# Patient Record
Sex: Male | Born: 1990 | Race: White | Hispanic: No | Marital: Single | Smoking: Former smoker
Health system: Southern US, Community
[De-identification: ages and names within clinical notes are randomized; demographics above are authoritative.]

## PROBLEM LIST (undated history)

## (undated) DIAGNOSIS — R569 Unspecified convulsions: Secondary | ICD-10-CM

## (undated) DIAGNOSIS — F1910 Other psychoactive substance abuse, uncomplicated: Secondary | ICD-10-CM

## (undated) DIAGNOSIS — F32A Depression, unspecified: Secondary | ICD-10-CM

## (undated) DIAGNOSIS — F329 Major depressive disorder, single episode, unspecified: Secondary | ICD-10-CM

## (undated) DIAGNOSIS — E669 Obesity, unspecified: Secondary | ICD-10-CM

## (undated) HISTORY — DX: Unspecified convulsions: R56.9

## (undated) HISTORY — PX: SHOULDER SURGERY: SHX246

## (undated) HISTORY — PX: LEG SURGERY: SHX1003

## (undated) HISTORY — PX: TONSILLECTOMY: SUR1361

## (undated) HISTORY — PX: OTHER SURGICAL HISTORY: SHX169

---
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
## 2009-05-20 DIAGNOSIS — J309 Allergic rhinitis, unspecified: Secondary | ICD-10-CM | POA: Insufficient documentation

## 2009-05-20 DIAGNOSIS — E669 Obesity, unspecified: Secondary | ICD-10-CM | POA: Insufficient documentation

## 2011-03-14 ENCOUNTER — Encounter: Payer: Self-pay | Admitting: Neurosurgery

## 2011-04-13 ENCOUNTER — Encounter: Payer: Self-pay | Admitting: Neurosurgery

## 2011-06-14 ENCOUNTER — Encounter: Payer: Self-pay | Admitting: Orthopedic Surgery

## 2011-07-11 ENCOUNTER — Encounter: Payer: Self-pay | Admitting: Orthopedic Surgery

## 2011-08-11 ENCOUNTER — Encounter: Payer: Self-pay | Admitting: Orthopedic Surgery

## 2011-09-10 ENCOUNTER — Encounter: Payer: Self-pay | Admitting: Orthopedic Surgery

## 2012-04-28 ENCOUNTER — Ambulatory Visit: Payer: Self-pay | Admitting: Unknown Physician Specialty

## 2013-08-13 LAB — BASIC METABOLIC PANEL
BUN: 13 mg/dL (ref 4–21)
Creatinine: 0.7 mg/dL (ref 0.6–1.3)
GLUCOSE: 94 mg/dL
POTASSIUM: 4.6 mmol/L (ref 3.4–5.3)
Sodium: 140 mmol/L (ref 137–147)

## 2013-08-13 LAB — HEPATIC FUNCTION PANEL
ALT: 56 U/L — AB (ref 10–40)
AST: 22 U/L (ref 14–40)
Alkaline Phosphatase: 76 U/L (ref 25–125)
BILIRUBIN, TOTAL: 0.8 mg/dL

## 2013-08-13 LAB — CBC AND DIFFERENTIAL
HCT: 44 % (ref 41–53)
HEMOGLOBIN: 15.3 g/dL (ref 13.5–17.5)
NEUTROS ABS: 6 /uL
PLATELETS: 321 10*3/uL (ref 150–399)
WBC: 8.5 10*3/mL

## 2013-08-13 LAB — TSH: TSH: 0.99 u[IU]/mL (ref 0.41–5.90)

## 2013-09-30 ENCOUNTER — Emergency Department: Payer: Self-pay | Admitting: Internal Medicine

## 2013-09-30 LAB — CBC
HCT: 45.1 % (ref 40.0–52.0)
HGB: 15.7 g/dL (ref 13.0–18.0)
MCH: 29.7 pg (ref 26.0–34.0)
MCHC: 34.8 g/dL (ref 32.0–36.0)
MCV: 85 fL (ref 80–100)
Platelet: 327 10*3/uL (ref 150–440)
RBC: 5.29 10*6/uL (ref 4.40–5.90)
RDW: 13 % (ref 11.5–14.5)
WBC: 8 10*3/uL (ref 3.8–10.6)

## 2013-09-30 LAB — DRUG SCREEN, URINE
Amphetamines, Ur Screen: NEGATIVE (ref ?–1000)
Barbiturates, Ur Screen: NEGATIVE (ref ?–200)
Benzodiazepine, Ur Scrn: NEGATIVE (ref ?–200)
CANNABINOID 50 NG, UR ~~LOC~~: NEGATIVE (ref ?–50)
Cocaine Metabolite,Ur ~~LOC~~: NEGATIVE (ref ?–300)
MDMA (ECSTASY) UR SCREEN: NEGATIVE (ref ?–500)
METHADONE, UR SCREEN: NEGATIVE (ref ?–300)
Opiate, Ur Screen: POSITIVE (ref ?–300)
PHENCYCLIDINE (PCP) UR S: NEGATIVE (ref ?–25)
Tricyclic, Ur Screen: NEGATIVE (ref ?–1000)

## 2013-09-30 LAB — COMPREHENSIVE METABOLIC PANEL
ALK PHOS: 76 U/L
ALT: 49 U/L
ANION GAP: 8 (ref 7–16)
Albumin: 3.7 g/dL (ref 3.4–5.0)
BUN: 11 mg/dL (ref 7–18)
Bilirubin,Total: 0.9 mg/dL (ref 0.2–1.0)
Calcium, Total: 8.7 mg/dL (ref 8.5–10.1)
Chloride: 102 mmol/L (ref 98–107)
Co2: 26 mmol/L (ref 21–32)
Creatinine: 0.88 mg/dL (ref 0.60–1.30)
EGFR (Non-African Amer.): >60
GLUCOSE: 72 mg/dL (ref 65–99)
Osmolality: 270 (ref 275–301)
Potassium: 4.1 mmol/L (ref 3.5–5.1)
SGOT(AST): 24 U/L (ref 15–37)
Sodium: 136 mmol/L (ref 136–145)
Total Protein: 7.7 g/dL (ref 6.4–8.2)

## 2013-09-30 LAB — ACETAMINOPHEN LEVEL: Acetaminophen: <2 ug/mL

## 2013-09-30 LAB — TSH: Thyroid Stimulating Horm: 0.944 u[IU]/mL

## 2013-09-30 LAB — URINALYSIS, COMPLETE
Bilirubin,UR: NEGATIVE
Blood: NEGATIVE
GLUCOSE, UR: NEGATIVE mg/dL (ref 0–75)
Ketone: NEGATIVE
Leukocyte Esterase: NEGATIVE
NITRITE: NEGATIVE
PROTEIN: NEGATIVE
Ph: 6 (ref 4.5–8.0)
SPECIFIC GRAVITY: 1.01 (ref 1.003–1.030)

## 2013-09-30 LAB — D-DIMER(ARMC): D-Dimer: 365 ng/ml

## 2013-09-30 LAB — SALICYLATE LEVEL: Salicylates, Serum: <1.7 mg/dL

## 2013-09-30 LAB — ETHANOL: Ethanol: <3 mg/dL

## 2013-10-05 ENCOUNTER — Encounter: Payer: Self-pay | Admitting: Neurology

## 2013-10-05 ENCOUNTER — Ambulatory Visit (INDEPENDENT_AMBULATORY_CARE_PROVIDER_SITE_OTHER): Payer: BC Managed Care – PPO | Admitting: Neurology

## 2013-10-05 ENCOUNTER — Ambulatory Visit (INDEPENDENT_AMBULATORY_CARE_PROVIDER_SITE_OTHER): Payer: BC Managed Care – PPO | Admitting: Radiology

## 2013-10-05 DIAGNOSIS — R569 Unspecified convulsions: Secondary | ICD-10-CM
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
## 2013-10-06 NOTE — Procedures (Signed)
   HISTORY: 23 years old male presented with seizure event in July 2015  TECHNIQUE:  16 channel EEG was performed based on standard 10-16 international system. One channel was dedicated to EKG, which has demonstrates normal sinus rhythm of 84 beats per minutes.  Upon awakening, the posterior background activity was well-developed, in alpha range, 9 Hz,   reactive to eye opening and closure.  There was no evidence of epileptiform discharge.  Photic stimulation was performed, which induced a symmetric photic driving.  Hyperventilation was performed, there was no abnormality elicit.  Stage II sleep was achieved, as evident by k complex and sleep spindles.  CONCLUSION: This is a  normal awake and asleep EEG.  There is no electrodiagnostic evidence of epileptiform discharge

## 2013-10-07 ENCOUNTER — Ambulatory Visit: Payer: Self-pay | Admitting: Family Medicine

## 2013-10-16 ENCOUNTER — Ambulatory Visit
Admission: RE | Admit: 2013-10-16 | Discharge: 2013-10-16 | Disposition: A | Discharge status: Home / Self Care | Payer: BC Managed Care – PPO | Source: Ambulatory Visit | Attending: Neurology | Admitting: Neurology

## 2013-10-16 DIAGNOSIS — R569 Unspecified convulsions: Secondary | ICD-10-CM

## 2013-10-16 MED ORDER — GADOBENATE DIMEGLUMINE 529 MG/ML IV SOLN
20.0000 mL | Freq: Once | INTRAVENOUS | Status: AC | PRN
  Administered 2013-10-16: 20 mL via INTRAVENOUS

## 2013-10-19 ENCOUNTER — Telehealth: Payer: Self-pay | Admitting: Neurology

## 2013-10-19 NOTE — Telephone Encounter (Signed)
Phillip Moss,Phillip Moss DOB 1990/07/30 is returning a call.

## 2013-10-19 NOTE — Telephone Encounter (Signed)
Patient was left a vm about the results.

## 2013-10-19 NOTE — Telephone Encounter (Signed)
Please call.

## 2014-04-08 ENCOUNTER — Ambulatory Visit: Payer: BC Managed Care – PPO | Admitting: Nurse Practitioner

## 2015-01-31 DIAGNOSIS — F1110 Opioid abuse, uncomplicated: Secondary | ICD-10-CM | POA: Insufficient documentation

## 2015-01-31 DIAGNOSIS — F1121 Opioid dependence, in remission: Secondary | ICD-10-CM | POA: Insufficient documentation

## 2015-01-31 DIAGNOSIS — F4310 Post-traumatic stress disorder, unspecified: Secondary | ICD-10-CM | POA: Insufficient documentation

## 2015-01-31 DIAGNOSIS — F321 Major depressive disorder, single episode, moderate: Secondary | ICD-10-CM | POA: Insufficient documentation

## 2015-01-31 DIAGNOSIS — G2579 Other drug induced movement disorders: Secondary | ICD-10-CM | POA: Insufficient documentation

## 2015-01-31 DIAGNOSIS — F419 Anxiety disorder, unspecified: Secondary | ICD-10-CM | POA: Insufficient documentation

## 2015-01-31 DIAGNOSIS — F410 Panic disorder [episodic paroxysmal anxiety] without agoraphobia: Secondary | ICD-10-CM | POA: Insufficient documentation

## 2015-02-01 ENCOUNTER — Encounter: Payer: Self-pay | Admitting: Family Medicine

## 2015-02-01 ENCOUNTER — Ambulatory Visit (INDEPENDENT_AMBULATORY_CARE_PROVIDER_SITE_OTHER): Payer: BLUE CROSS/BLUE SHIELD | Admitting: Family Medicine

## 2015-02-01 VITALS — BP 112/68 | HR 76 | Temp 97.9°F | Resp 16 | Ht 71.0 in | Wt 311.0 lb

## 2015-02-01 DIAGNOSIS — R634 Abnormal weight loss: Secondary | ICD-10-CM

## 2015-02-01 DIAGNOSIS — R197 Diarrhea, unspecified: Secondary | ICD-10-CM | POA: Diagnosis not present

## 2015-02-01 DIAGNOSIS — E739 Lactose intolerance, unspecified: Secondary | ICD-10-CM

## 2015-02-01 DIAGNOSIS — R5383 Other fatigue: Secondary | ICD-10-CM

## 2015-02-01 DIAGNOSIS — R1084 Generalized abdominal pain: Secondary | ICD-10-CM

## 2015-02-01 NOTE — Progress Notes (Signed)
Patient ID: Phillip Moss, male   DOB: 03-30-1990, 24 y.o.   MRN: [id number]    Subjective:  HPI Pt is here for today for weight loss, abdominal and very soft stools, almost diarrhea. He had this since he stopped using drugs and alcohol in 11/2013 and during his drug use. He always thought it was from the alcohol and drug use. He had been clean a year and still having these problems. He said that it is intermittent and if he does not eat, he does not have these symptoms Per his records he has lost 26 pounds, granted he is overweight but he is not trying to lose weight and continues to lose weight. He also says that he has a lot of fatigue and intermittent lose of appetite and occasionally nausea.  Pt reports that he thought his symptoms were from Vivtrol but these symptoms have gotten worse since he stopped the Vivtrol injections.    Prior to Admission medications   Medication Sig Start Date End Date Taking? Authorizing Provider  buPROPion (WELLBUTRIN XL) 300 MG 24 hr tablet Take by mouth. 06/29/14  Yes Historical Provider, MD  naltrexone (DEPADE) 50 MG tablet PRN 01/10/15  Yes Historical Provider, MD  sertraline (ZOLOFT) 100 MG tablet Take 100 mg by mouth daily.   Yes Historical Provider, MD  Naltrexone (VIVITROL) 380 MG SUSR Inject into the muscle.    Historical Provider, MD    Patient Active Problem List   Diagnosis Date Noted  . Anxiety 01/31/2015  . Major depressive disorder, single episode, moderate (HCC) 01/31/2015  . Drug abuse, opioid type 01/31/2015  . Opioid dependence in remission (HCC) 01/31/2015  . Panic attack 01/31/2015  . Posttraumatic stress disorder 01/31/2015  . Serotonergic syndrome 01/31/2015  . Seizure (HCC)   . Allergic rhinitis 05/20/2009  . Adiposity 05/20/2009    Past Medical History  Diagnosis Date  . Seizure [facility](HCC)     Social History   Social History  . Marital Status: Single    Spouse Name: N/A  . Number of Children: 0  . Years of Education:  college   Occupational History  .      [employer]   Social History Main Topics  . Smoking status: Former Games developermoker  . Smokeless tobacco: Never Used     Comment: Quit in 2016  . Alcohol Use: No     Comment: heavy drinker in the past  . Drug Use: No     Comment: heroin use in the past. Completed rehab  . Sexual Activity: Not on file   Other Topics Concern  . Not on file   Social History Narrative   Patient lives at home with his parents. Patient works at [employer].    Education college.   Right handed.   Caffeine - None    Allergies  Allergen Reactions  . Penicillins     rash    Review of Systems  Constitutional: Positive for weight loss and malaise/fatigue.  HENT: Negative.   Eyes: Negative.   Respiratory: Negative.   Cardiovascular: Negative.   Gastrointestinal: Positive for nausea.  Genitourinary: Negative.   Musculoskeletal: Negative.   Skin: Negative.   Neurological: Negative.   Endo/Heme/Allergies: Negative.   Psychiatric/Behavioral: Negative.      There is no immunization history on file for this patient. Objective:  BP 112/68 mmHg  Pulse 76  Temp(Src) 97.9 F (36.6 C) (Oral)  Resp 16  Ht 5\' 11"  (1.803 m)  Wt 311  lb (141.069 kg)  BMI 43.40 kg/m2  Physical Exam  Constitutional: He is oriented to person, place, and time and well-developed, well-nourished, and in no distress.  HENT:  Head: Normocephalic and atraumatic.  Right Ear: External ear normal.  Left Ear: External ear normal.  Nose: Nose normal.  Mouth/Throat: Oropharynx is clear and moist.  Eyes: Conjunctivae and EOM are normal. Pupils are equal, round, and reactive to light.  Neck: Normal range of motion. Neck supple.  Cardiovascular: Normal rate, regular rhythm, normal heart sounds and intact distal pulses.   Pulmonary/Chest: Effort normal and breath sounds normal.  Abdominal: Soft. Bowel sounds are normal.  Musculoskeletal: Normal range of motion.    Neurological: He is alert and oriented to person, place, and time. He has normal reflexes. Gait normal. GCS score is 15.  Skin: Skin is warm and dry.  Psychiatric: Mood, memory, affect and judgment normal.    Lab Results  Component Value Date   WBC 8.0 09/30/2013   HGB 15.7 09/30/2013   HCT 45.1 09/30/2013   PLT 327 09/30/2013   GLUCOSE 72 09/30/2013   TSH 0.99 08/13/2013    CMP     Component Value Date/Time   NA 136 09/30/2013 1646   NA 140 08/13/2013   K 4.1 09/30/2013 1646   K 4.6 08/13/2013   CL 102 09/30/2013 1646   CO2 26 09/30/2013 1646   GLUCOSE 72 09/30/2013 1646   BUN 11 09/30/2013 1646   BUN 13 08/13/2013   CREATININE 0.88 09/30/2013 1646   CREATININE 0.7 08/13/2013   CALCIUM 8.7 09/30/2013 1646   PROT 7.7 09/30/2013 1646   ALBUMIN 3.7 09/30/2013 1646   AST 24 09/30/2013 1646   AST 22 08/13/2013   ALT 49 09/30/2013 1646   ALT 56* 08/13/2013   ALKPHOS 76 09/30/2013 1646   ALKPHOS 76 08/13/2013   BILITOT 0.9 09/30/2013 1646   GFRNONAA >60 09/30/2013 1646   GFRAA >60 09/30/2013 1646    Assessment and Plan :  1. Generalized abdominal pain  - Comprehensive metabolic panel - Ambulatory referral to Gastroenterology - Sedimentation rate  2. Lactose intolerance Possibly. Pt will stop diary products and see if symptoms get better. Follow up in 2-3 months.  3. Diarrhea, unspecified type Consider stopping glutens in future. - Ambulatory referral to Gastroenterology  4. Weight loss  - CBC with Differential/Platelet - TSH - Ambulatory referral to Gastroenterology - Sedimentation rate  5. Other fatigue  - CBC with Differential/Platelet - TSH - Sedimentation rate 6.Addiction Controlled --off of all substances. Patient was seen and examined by Dr. Julieanne Manson, and noted scribed by Dimas Chyle, CMA  Julieanne Manson MD [facility] 02/01/2015 1:50 PM

## 2015-02-02 LAB — CBC WITH DIFFERENTIAL/PLATELET
Basophils Absolute: 0 10*3/uL (ref 0.0–0.2)
Basos: 0 %
EOS (ABSOLUTE): 0.2 10*3/uL (ref 0.0–0.4)
EOS: 2 %
HEMATOCRIT: 45.4 % (ref 37.5–51.0)
HEMOGLOBIN: 16 g/dL (ref 12.6–17.7)
Immature Grans (Abs): 0 10*3/uL (ref 0.0–0.1)
Immature Granulocytes: 0 %
LYMPHS ABS: 2.2 10*3/uL (ref 0.7–3.1)
Lymphs: 26 %
MCH: 30.5 pg (ref 26.6–33.0)
MCHC: 35.2 g/dL (ref 31.5–35.7)
MCV: 87 fL (ref 79–97)
MONOCYTES: 5 %
MONOS ABS: 0.4 10*3/uL (ref 0.1–0.9)
NEUTROS ABS: 5.5 10*3/uL (ref 1.4–7.0)
Neutrophils: 67 %
Platelets: 353 10*3/uL (ref 150–379)
RBC: 5.25 x10E6/uL (ref 4.14–5.80)
RDW: 12.8 % (ref 12.3–15.4)
WBC: 8.3 10*3/uL (ref 3.4–10.8)

## 2015-02-02 LAB — COMPREHENSIVE METABOLIC PANEL
A/G RATIO: 1.9 (ref 1.1–2.5)
ALBUMIN: 4.9 g/dL (ref 3.5–5.5)
ALK PHOS: 80 IU/L (ref 39–117)
ALT: 26 IU/L (ref 0–44)
AST: 16 IU/L (ref 0–40)
BILIRUBIN TOTAL: 1.1 mg/dL (ref 0.0–1.2)
BUN / CREAT RATIO: 15 (ref 8–19)
BUN: 12 mg/dL (ref 6–20)
CHLORIDE: 99 mmol/L (ref 97–106)
CO2: 25 mmol/L (ref 18–29)
Calcium: 9.9 mg/dL (ref 8.7–10.2)
Creatinine, Ser: 0.78 mg/dL (ref 0.76–1.27)
GFR calc non Af Amer: 125 mL/min/{1.73_m2} (ref >59)
GFR, EST AFRICAN AMERICAN: 144 mL/min/{1.73_m2} (ref >59)
GLOBULIN, TOTAL: 2.6 g/dL (ref 1.5–4.5)
Glucose: 86 mg/dL (ref 65–99)
Potassium: 4.5 mmol/L (ref 3.5–5.2)
SODIUM: 140 mmol/L (ref 136–144)
TOTAL PROTEIN: 7.5 g/dL (ref 6.0–8.5)

## 2015-02-02 LAB — SEDIMENTATION RATE: SED RATE: 4 mm/h (ref 0–15)

## 2015-02-02 LAB — TSH: TSH: 0.807 u[IU]/mL (ref 0.450–4.500)

## 2015-02-07 ENCOUNTER — Ambulatory Visit: Payer: Self-pay | Admitting: Family Medicine

## 2015-02-08 ENCOUNTER — Telehealth: Payer: Self-pay | Admitting: Family Medicine

## 2015-02-08 ENCOUNTER — Telehealth: Payer: Self-pay | Admitting: Gastroenterology

## 2015-02-08 NOTE — Telephone Encounter (Signed)
Please review, anyway to get him any sooner somewhere else-aa

## 2015-02-08 NOTE — Telephone Encounter (Signed)
[facility].  They cant see him until Dec 23.  Can you refer him somewhere that will see him sooner.  His call back [phone]  Thanks Barth Kirkseri

## 2015-02-08 NOTE — Telephone Encounter (Signed)
I have called patient again per the referral sent to see Dr Servando SnareWohl. No answer. I have left a detailed message.

## 2015-02-09 NOTE — Telephone Encounter (Signed)
[name] [facility] 02/10/15 at 8:30

## 2015-02-10 ENCOUNTER — Other Ambulatory Visit: Payer: Self-pay | Admitting: Student

## 2015-02-10 DIAGNOSIS — R1084 Generalized abdominal pain: Secondary | ICD-10-CM

## 2015-02-10 DIAGNOSIS — R194 Change in bowel habit: Secondary | ICD-10-CM

## 2015-02-16 ENCOUNTER — Ambulatory Visit
Admission: RE | Admit: 2015-02-16 | Discharge: 2015-02-16 | Disposition: A | Discharge status: Home / Self Care | Payer: BLUE CROSS/BLUE SHIELD | Source: Ambulatory Visit | Attending: Student | Admitting: Student

## 2015-02-16 DIAGNOSIS — R1084 Generalized abdominal pain: Secondary | ICD-10-CM | POA: Diagnosis present

## 2015-02-16 DIAGNOSIS — R194 Change in bowel habit: Secondary | ICD-10-CM | POA: Diagnosis present

## 2015-02-16 MED ORDER — IOHEXOL 350 MG/ML SOLN
150.0000 mL | Freq: Once | INTRAVENOUS | Status: AC | PRN
  Administered 2015-02-16: 100 mL via INTRAVENOUS

## 2015-03-09 ENCOUNTER — Other Ambulatory Visit: Payer: Self-pay | Admitting: Family Medicine

## 2015-03-10 ENCOUNTER — Other Ambulatory Visit: Payer: Self-pay | Admitting: Family Medicine

## 2015-03-24 ENCOUNTER — Encounter: Payer: Self-pay | Admitting: *Deleted

## 2015-03-25 ENCOUNTER — Ambulatory Visit: Payer: BLUE CROSS/BLUE SHIELD | Admitting: Certified Registered Nurse Anesthetist

## 2015-03-25 ENCOUNTER — Encounter
Admission: RE | Disposition: A | Discharge status: Home / Self Care | Payer: Self-pay | Source: Ambulatory Visit | Attending: Gastroenterology

## 2015-03-25 ENCOUNTER — Encounter: Payer: Self-pay | Admitting: *Deleted

## 2015-03-25 ENCOUNTER — Ambulatory Visit
Admission: RE | Admit: 2015-03-25 | Discharge: 2015-03-25 | Disposition: A | Discharge status: Home / Self Care | Payer: BLUE CROSS/BLUE SHIELD | Source: Ambulatory Visit | Attending: Gastroenterology | Admitting: Gastroenterology

## 2015-03-25 DIAGNOSIS — F329 Major depressive disorder, single episode, unspecified: Secondary | ICD-10-CM | POA: Insufficient documentation

## 2015-03-25 DIAGNOSIS — E669 Obesity, unspecified: Secondary | ICD-10-CM | POA: Insufficient documentation

## 2015-03-25 DIAGNOSIS — K529 Noninfective gastroenteritis and colitis, unspecified: Secondary | ICD-10-CM | POA: Insufficient documentation

## 2015-03-25 DIAGNOSIS — Z6841 Body Mass Index (BMI) 40.0 and over, adult: Secondary | ICD-10-CM | POA: Insufficient documentation

## 2015-03-25 DIAGNOSIS — Z87891 Personal history of nicotine dependence: Secondary | ICD-10-CM | POA: Insufficient documentation

## 2015-03-25 DIAGNOSIS — Z880 Allergy status to penicillin: Secondary | ICD-10-CM | POA: Insufficient documentation

## 2015-03-25 DIAGNOSIS — Z79899 Other long term (current) drug therapy: Secondary | ICD-10-CM | POA: Insufficient documentation

## 2015-03-25 DIAGNOSIS — R1030 Lower abdominal pain, unspecified: Secondary | ICD-10-CM | POA: Insufficient documentation

## 2015-03-25 DIAGNOSIS — R634 Abnormal weight loss: Secondary | ICD-10-CM | POA: Insufficient documentation

## 2015-03-25 HISTORY — DX: Obesity, unspecified: E66.9

## 2015-03-25 HISTORY — DX: Depression, unspecified: F32.A

## 2015-03-25 HISTORY — DX: Major depressive disorder, single episode, unspecified: F32.9

## 2015-03-25 HISTORY — PX: COLONOSCOPY WITH PROPOFOL: SHX5780

## 2015-03-25 HISTORY — DX: Other psychoactive substance abuse, uncomplicated: F19.10

## 2015-03-25 LAB — URINE DRUG SCREEN, QUALITATIVE (ARMC ONLY)
AMPHETAMINES, UR SCREEN: NOT DETECTED
BENZODIAZEPINE, UR SCRN: NOT DETECTED
Barbiturates, Ur Screen: NOT DETECTED
CANNABINOID 50 NG, UR ~~LOC~~: POSITIVE — AB
Cocaine Metabolite,Ur ~~LOC~~: NOT DETECTED
MDMA (ECSTASY) UR SCREEN: NOT DETECTED
METHADONE SCREEN, URINE: NOT DETECTED
Opiate, Ur Screen: NOT DETECTED
Phencyclidine (PCP) Ur S: NOT DETECTED
TRICYCLIC, UR SCREEN: NOT DETECTED

## 2015-03-25 LAB — SURGICAL PATHOLOGY

## 2015-03-25 LAB — HM COLONOSCOPY

## 2015-03-25 SURGERY — COLONOSCOPY WITH PROPOFOL
Anesthesia: General

## 2015-03-25 MED ORDER — LIDOCAINE HCL (CARDIAC) 20 MG/ML IV SOLN
INTRAVENOUS | Status: DC | PRN
  Administered 2015-03-25: 40 mg via INTRAVENOUS

## 2015-03-25 MED ORDER — MIDAZOLAM HCL 2 MG/2ML IJ SOLN
INTRAMUSCULAR | Status: DC | PRN
  Administered 2015-03-25: 2 mg via INTRAVENOUS

## 2015-03-25 MED ORDER — SODIUM CHLORIDE 0.9 % IV SOLN
INTRAVENOUS | Status: DC
  Administered 2015-03-25: 13:00:00 via INTRAVENOUS

## 2015-03-25 MED ORDER — PROPOFOL 10 MG/ML IV BOLUS
INTRAVENOUS | Status: DC | PRN
  Administered 2015-03-25 (×2): 20 mg via INTRAVENOUS
  Administered 2015-03-25: 80 mg via INTRAVENOUS
  Administered 2015-03-25: 50 mg via INTRAVENOUS
  Administered 2015-03-25: 20 mg via INTRAVENOUS
  Administered 2015-03-25: 50 mg via INTRAVENOUS
  Administered 2015-03-25 (×2): 30 mg via INTRAVENOUS

## 2015-03-25 MED ORDER — PROPOFOL 500 MG/50ML IV EMUL
INTRAVENOUS | Status: DC | PRN
  Administered 2015-03-25: 180 ug/kg/min via INTRAVENOUS

## 2015-03-25 NOTE — H&P (Signed)
  Primary Care Physician:  Phillip Mansichard Gilbert Jr, MD  Pre-Procedure History & Physical: HPI:  Phillip Moss is a 25 y.o. male is here for an colonoscopy.   Past Medical History  Diagnosis Date  . Seizure (HCC)   . Depression   . Drug abuse   . Obesity     Past Surgical History  Procedure Laterality Date  . Rod in leg Left   . Plate in shoulder Left   . Leg surgery      rod put in in the left leg after a fracture  . Shoulder surgery      plate put in after fracture-left  . Tonsillectomy      Prior to Admission medications   Medication Sig Start Date End Date Taking? Authorizing Provider  buPROPion (WELLBUTRIN XL) 300 MG 24 hr tablet TAKE 1 TABLET EVERY DAY 03/11/15  Yes Phillip Moss., MD  sertraline (ZOLOFT) 100 MG tablet Take 100 mg by mouth daily.   Yes Historical Provider, MD  naltrexone (DEPADE) 50 MG tablet Reported on 03/25/2015 01/10/15   Historical Provider, MD  Naltrexone (VIVITROL) 380 MG SUSR Inject into the muscle. Reported on 03/25/2015    Historical Provider, MD    Allergies as of 03/24/2015 - Review Complete 03/24/2015  Allergen Reaction Noted  . Penicillins  10/05/2013    Family History  Problem Relation Age of Onset  . High blood pressure Mother   . Hypertension Mother   . Hyperlipidemia Father     Social History   Social History  . Marital Status: Single    Spouse Name: N/A  . Number of Children: 0  . Years of Education: college   Occupational History  .      [employer]   Social History Main Topics  . Smoking status: Former Smoker    Quit date: 03/24/2014  . Smokeless tobacco: Never Used     Comment: Quit in 2016  . Alcohol Use: No     Comment: heavy drinker in the past  . Drug Use: No     Comment: heroin use in the past. Completed rehab  . Sexual Activity: Not on file   Other Topics Concern  . Not on file   Social History Narrative   Patient lives at home with his parents. Patient works at [employer].    Education college.   Right handed.   Caffeine - None     Physical Exam: BP 147/89 mmHg  Pulse 82  Temp(Src) 98.5 F (36.9 C) (Tympanic)  Resp 24  Ht 6' (1.829 m)  Wt 133.811 kg (295 lb)  BMI 40.00 kg/m2  SpO2 98% General:   Alert,  pleasant and cooperative in NAD Head:  Normocephalic and atraumatic. Neck:  Supple; no masses or thyromegaly. Lungs:  Clear throughout to auscultation.    Heart:  Regular rate and rhythm. Abdomen:  Soft, nontender and nondistended. Normal bowel sounds, without guarding, and without rebound.   Neurologic:  Alert and  oriented x4;  grossly normal neurologically.  Impression/Plan: Phillip Moss is here for an colonoscopy to be performed for diarrhea, weight loss, abd pain  Risks, benefits, limitations, and alternatives regarding  colonoscopy have been reviewed with the patient.  Questions have been answered.  All parties agreeable.   [name], [name], MD  03/25/2015, 2:22 PM

## 2015-03-25 NOTE — Transfer of Care (Signed)
Immediate Anesthesia Transfer of Care Note  Patient: Phillip FountainWilliam E Kalata Moss  Procedure(s) Performed: Procedure(s): COLONOSCOPY WITH PROPOFOL (N/A)  Patient Location: PACU  Anesthesia Type:General  Level of Consciousness: awake, alert  and oriented  Airway & Oxygen Therapy: Patient Spontanous Breathing and Patient connected to nasal cannula oxygen  Post-op Assessment: Report given to RN and Post -op Vital signs reviewed and stable  Post vital signs: Reviewed and stable  Last Vitals:  Filed Vitals:   03/25/15 1250  BP: 147/89  Pulse: 82  Temp: 36.9 C  Resp: 24    Complications: No apparent anesthesia complications

## 2015-03-25 NOTE — Anesthesia Procedure Notes (Signed)
Performed by: Lagina Reader Pre-anesthesia Checklist: Patient identified, Emergency Drugs available, Suction available and Patient being monitored Patient Re-evaluated:Patient Re-evaluated prior to inductionOxygen Delivery Method: Nasal cannula Intubation Type: IV induction       

## 2015-03-25 NOTE — Discharge Instructions (Signed)

## 2015-03-25 NOTE — Anesthesia Preprocedure Evaluation (Signed)
Anesthesia Evaluation  Patient identified by MRN, date of birth, ID band Patient awake    Reviewed: Allergy & Precautions, NPO status , Patient's Chart, lab work & pertinent test results  History of Anesthesia Complications Negative for: history of anesthetic complications  Airway Mallampati: III       Dental  (+) Teeth Intact   Pulmonary neg pulmonary ROS, former smoker,           Cardiovascular negative cardio ROS       Neuro/Psych Seizures - (tramadol induced),  Anxiety Depression    GI/Hepatic negative GI ROS, Neg liver ROS,   Endo/Other  negative endocrine ROS  Renal/GU negative Renal ROS     Musculoskeletal   Abdominal   Peds  Hematology negative hematology ROS (+)   Anesthesia Other Findings   Reproductive/Obstetrics                             Anesthesia Physical Anesthesia Plan  ASA: II  Anesthesia Plan: General   Post-op Pain Management:    Induction: Intravenous  Airway Management Planned: Nasal Cannula  Additional Equipment:   Intra-op Plan:   Post-operative Plan:   Informed Consent: I have reviewed the patients History and Physical, chart, labs and discussed the procedure including the risks, benefits and alternatives for the proposed anesthesia with the patient or authorized representative who has indicated his/her understanding and acceptance.     Plan Discussed with:   Anesthesia Plan Comments:         Anesthesia Quick Evaluation

## 2015-03-25 NOTE — Op Note (Signed)
[facility] Patient Name: Phillip Moss Procedure Date: 03/25/2015 2:18 PM MRN: [id number] Account #: [id number] Date of Birth: Nov 13, 1990 Admit Type: Outpatient Age: [age] [facility] Gender: Male Note Status: Finalized Procedure:         Colonoscopy Indications:       Chronic diarrhea, Lower abdominal pain, Weight loss Patient Profile:   This is a 25 year old male. Providers:         Rhona RaiderMatthew G. Shelle Ironein, MD Referring MD:      Ferdinand Langoichard L. Sullivan LoneGilbert, MD (Referring MD) Medicines:         Propofol per Anesthesia Complications:     No immediate complications. Procedure:         Pre-Anesthesia Assessment:                    - Prior to the procedure, a History and Physical was                     performed, and patient medications, allergies and                     sensitivities were reviewed. The patient's tolerance of                     previous anesthesia was reviewed.                    After obtaining informed consent, the colonoscope was                     passed under direct vision. Throughout the procedure, the                     patient's blood pressure, pulse, and oxygen saturations                     were monitored continuously. The Colonoscope was                     introduced through the anus and advanced to the the                     terminal ileum. The colonoscopy was performed without                     difficulty. The patient tolerated the procedure well. The                     quality of the bowel preparation was fair. Findings:      The perianal and digital rectal examinations were normal.      The terminal ileum appeared normal.      The entire examined colon appeared normal on direct and retroflexion       views.      Biopsies for histology were taken with a cold forceps from the right       colon, left colon and rectum for evaluation of microscopic colitis. Impression:        - The examined portion of the ileum was  normal.                    - The entire examined colon is normal on direct and  retroflexion views.                    - Biopsies were taken with a cold forceps from the right                     colon, left colon and rectum for evaluation of microscopic                     colitis. Recommendation:    - Observe patient in GI recovery unit.                    - Continue present medications.                    - Await pathology results.                    - Return to GI clinic.                    - Consider xifaxin for IBS-D if biopsies are non-diagnostic                    - Try low fodmap diet, probiotic                    - Perform stool studies.                    - The findings and recommendations were discussed with the                     patient.                    - The findings and recommendations were discussed with the                     patient's family. Procedure Code(s): --- Professional ---                    [phone], Colonoscopy, flexible; with biopsy, single or                     multiple Diagnosis Code(s): --- Professional ---                    K52.9, Noninfective gastroenteritis and colitis,                     unspecified                    R10.30, Lower abdominal pain, unspecified                    R63.4, Abnormal weight loss CPT copyright 2014 American Medical Association. All rights reserved. The codes documented in this report are preliminary and upon coder review may  be revised to meet current compliance requirements. Kathalene Frames, MD 03/25/2015 2:49:33 PM This report has been signed electronically. Number of Addenda: 0 Note Initiated On: 03/25/2015 2:18 PM Scope Withdrawal Time: 0 hours 11 minutes 47 seconds  Total Procedure Duration: 0 hours 13 minutes 4 seconds       [facility] [place]

## 2015-03-26 NOTE — Progress Notes (Signed)
Reached voicemail.  No message left.

## 2015-03-29 NOTE — Anesthesia Postprocedure Evaluation (Signed)
Anesthesia Post Note  Patient: Phillip Moss  Procedure(s) Performed: Procedure(s) (LRB): COLONOSCOPY WITH PROPOFOL (N/A)  Patient location during evaluation: Endoscopy Anesthesia Type: General Level of consciousness: awake and alert Pain management: pain level controlled Vital Signs Assessment: post-procedure vital signs reviewed and stable Respiratory status: spontaneous breathing, nonlabored ventilation, respiratory function stable and patient connected to nasal cannula oxygen Cardiovascular status: blood pressure returned to baseline and stable Postop Assessment: no signs of nausea or vomiting Anesthetic complications: no    Last Vitals:  Filed Vitals:   03/25/15 1518 03/25/15 1528  BP: 123/89 110/55  Pulse: 70 71  Temp:    Resp: 16 15    Last Pain: There were no vitals filed for this visit.               Lenard Simmer

## 2015-03-30 ENCOUNTER — Encounter: Payer: Self-pay | Admitting: Gastroenterology

## 2015-05-04 ENCOUNTER — Ambulatory Visit: Payer: Self-pay | Admitting: Family Medicine

## 2015-07-04 ENCOUNTER — Other Ambulatory Visit: Payer: Self-pay | Admitting: Family Medicine

## 2015-09-27 IMAGING — CT CT HEAD WITHOUT CONTRAST
1 series · 16 of 30 positions shown, 20 images · non-contrast
Comparison: None.

CLINICAL DATA: Seizure.  Postictal confusion.

EXAM:
CT HEAD WITHOUT CONTRAST
TECHNIQUE: Contiguous axial images were obtained from the base of the skull
through the vertex without intravenous contrast.

[Series 2: head wo · axial · 0.45mm/px · z∈[-116,+28]mm · 16 of 36 slices shown, 20 images]
[im 2/36  brain]
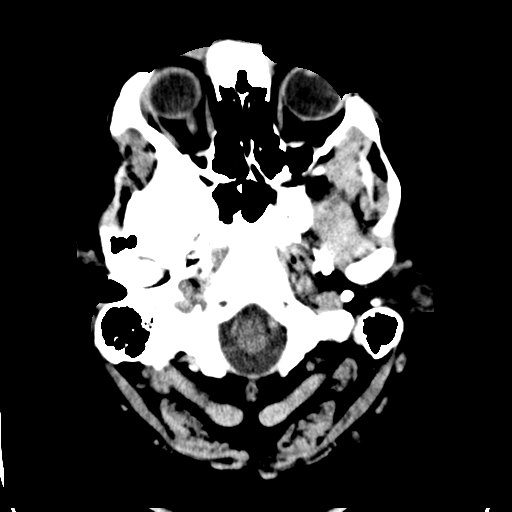
[im 2/36  bone]
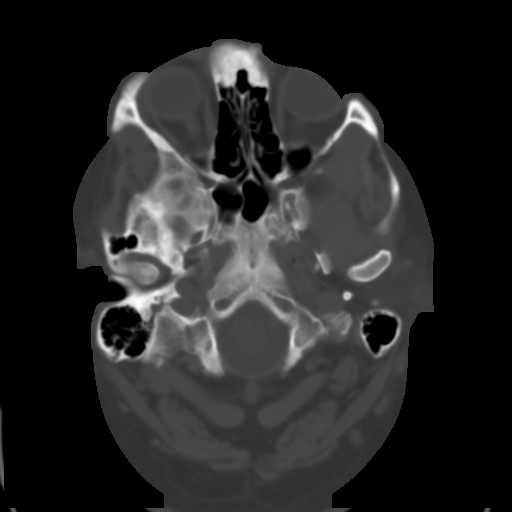
[im 4/36  brain]
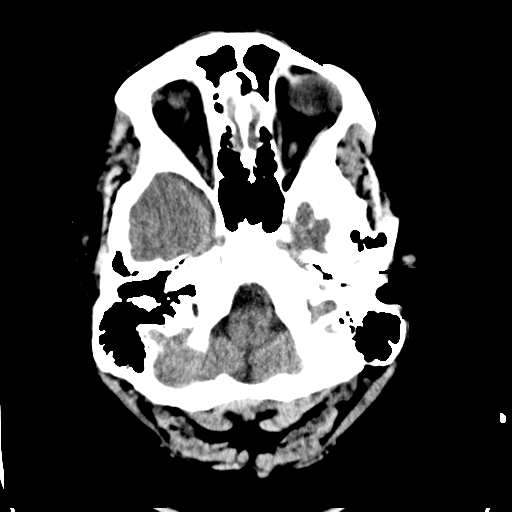
[im 7/36  brain]
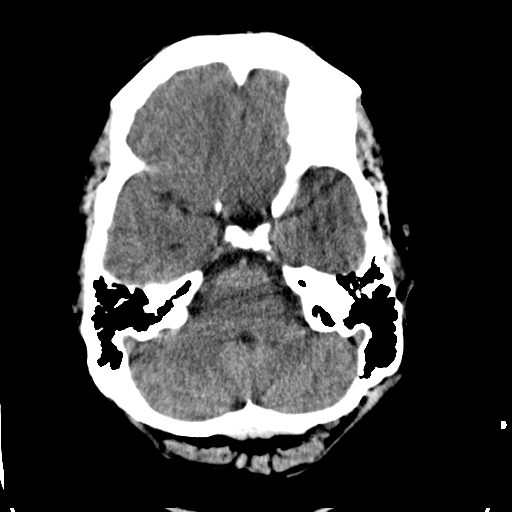
[im 9/36  brain]
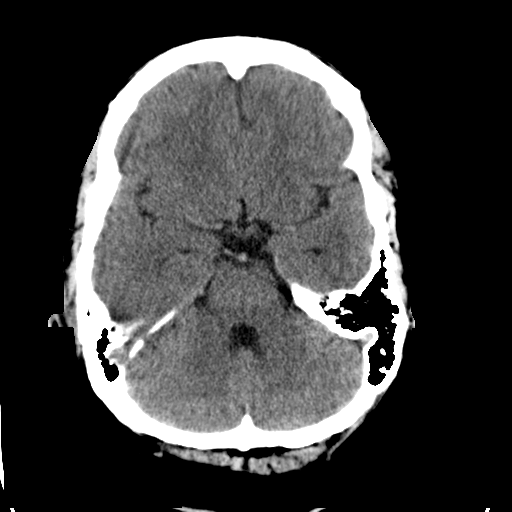
[im 10/36  brain]
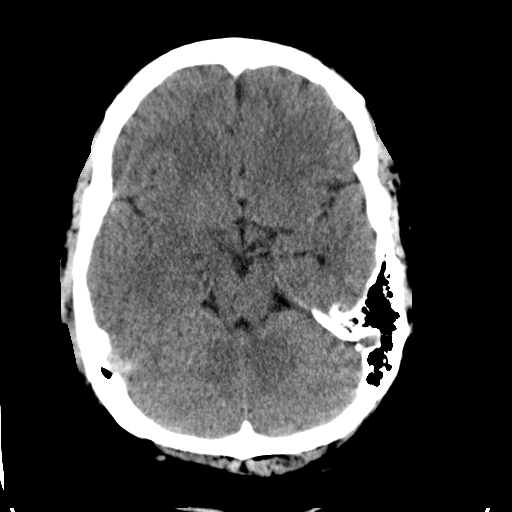
[im 10/36  bone]
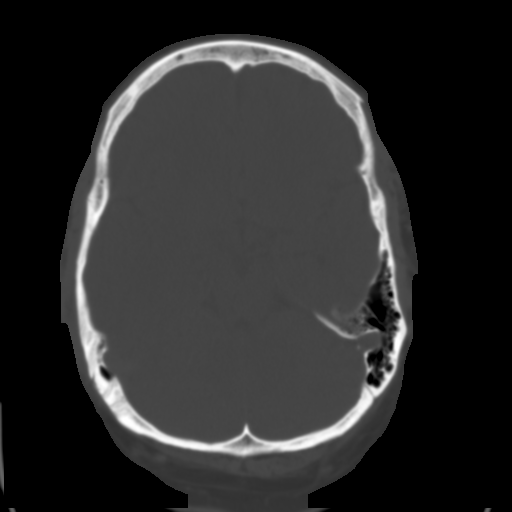
[im 13/36  brain]
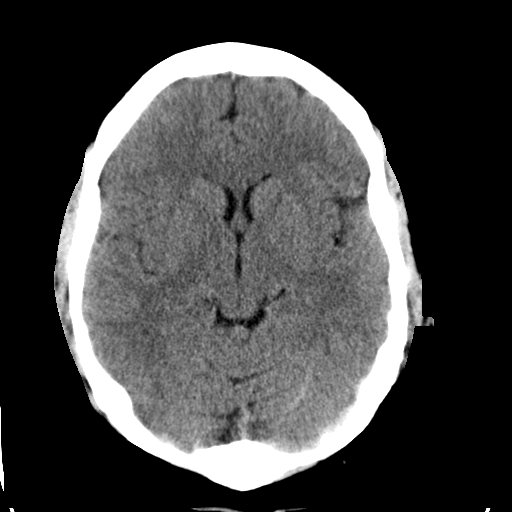
[im 15/36  brain]
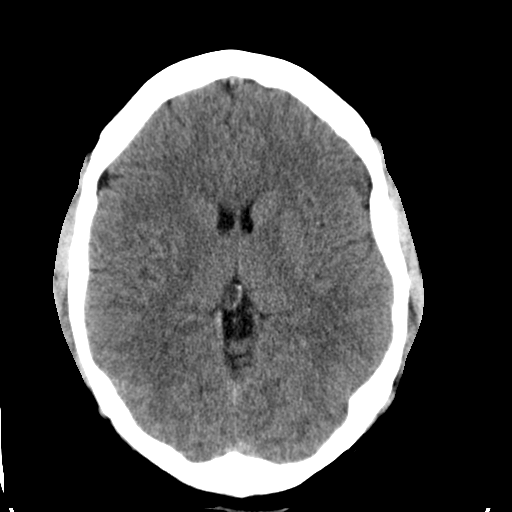
[im 17/36  brain]
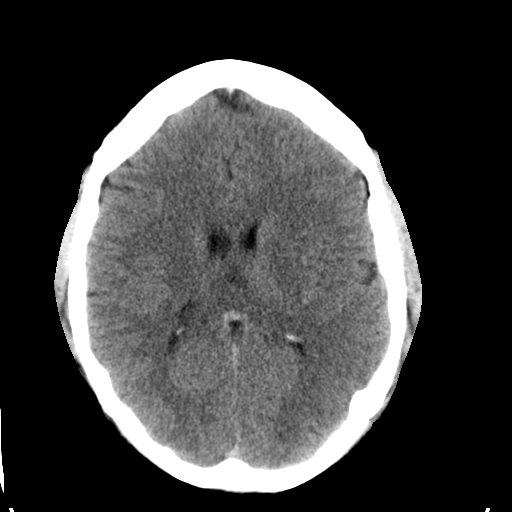
[im 19/36  brain]
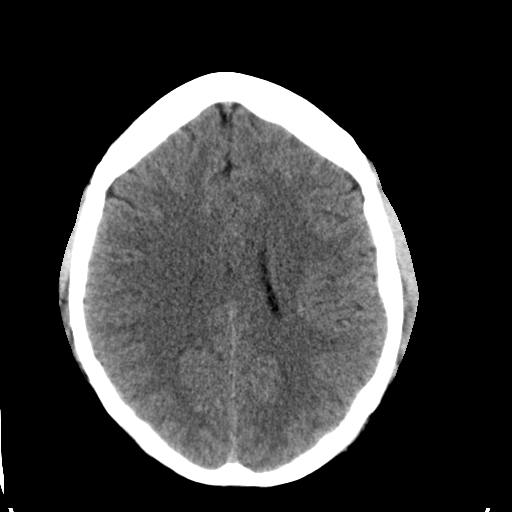
[im 19/36  bone]
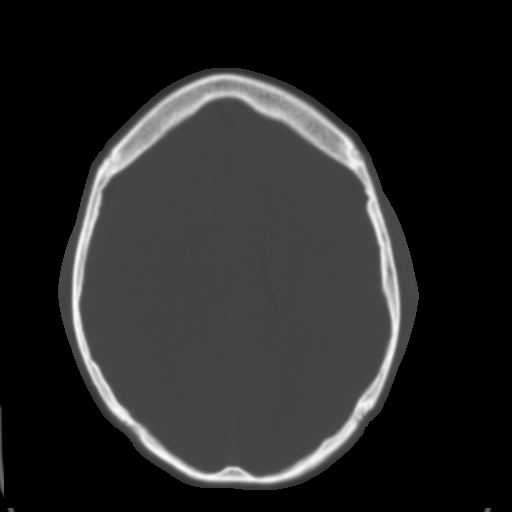
[im 21/36  brain]
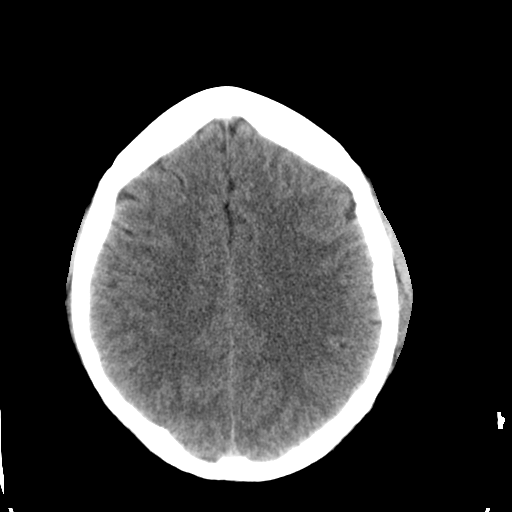
[im 23/36  brain]
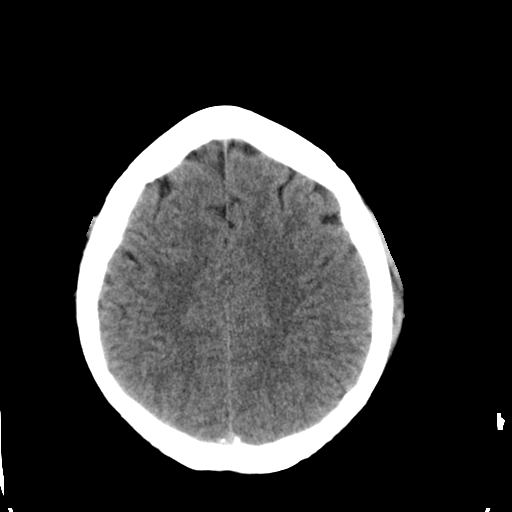
[im 26/36  brain]
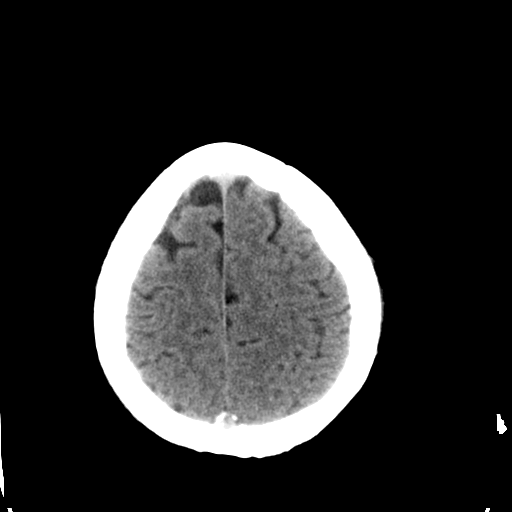
[im 27/36  brain]
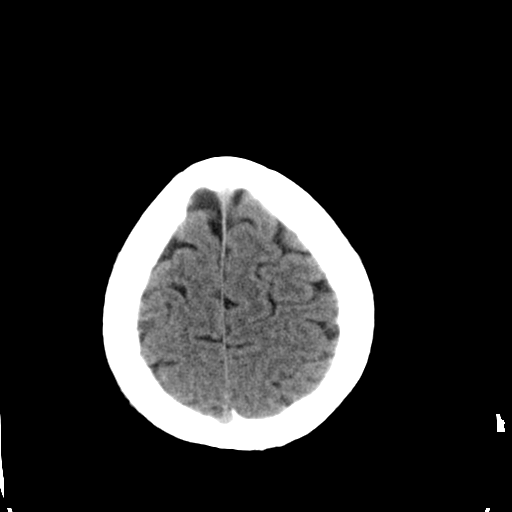
[im 27/36  bone]
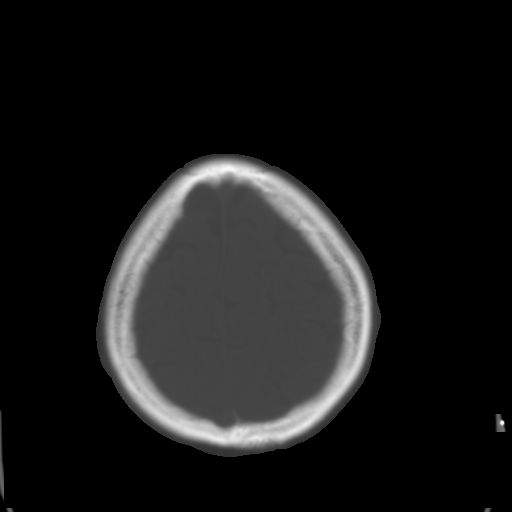
[im 29/36  brain]
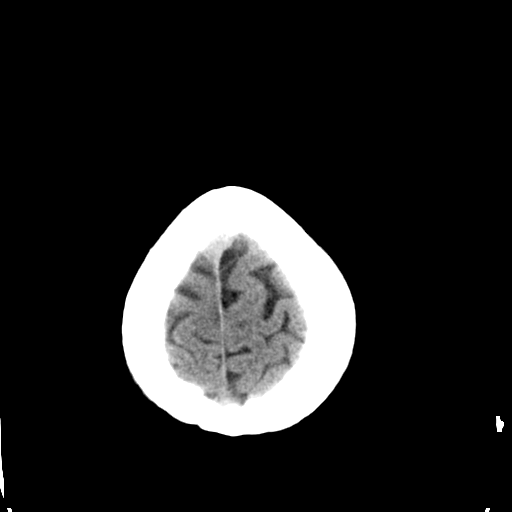
[im 32/36  brain]
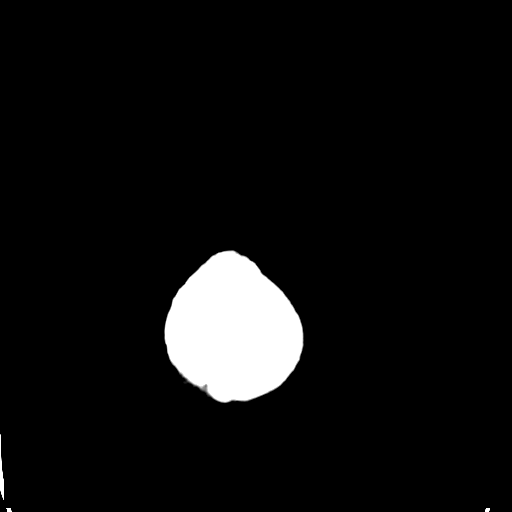
[im 34/36  brain]
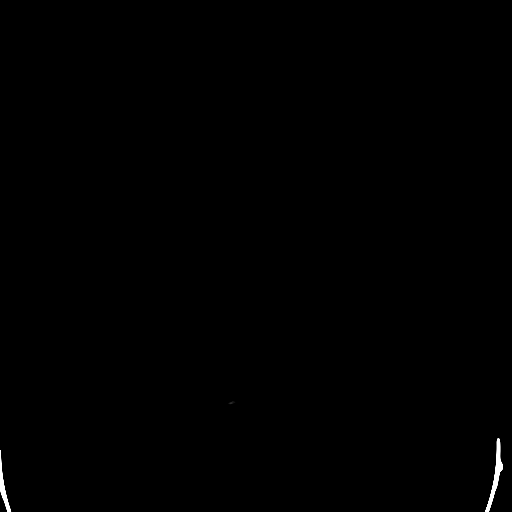

[16 of 30 positions shown; findings below may reference images not displayed]

FINDINGS: No evidence of intracranial hemorrhage, brain edema, or other signs
of acute infarction. No evidence of intracranial mass lesion or mass
effect. No abnormal extraaxial fluid collections identified.
Ventricles are normal in size. No skull abnormality identified.
IMPRESSION: Negative noncontrast head CT.

## 2016-02-20 ENCOUNTER — Other Ambulatory Visit: Payer: Self-pay | Admitting: Family Medicine

## 2016-04-04 ENCOUNTER — Ambulatory Visit (INDEPENDENT_AMBULATORY_CARE_PROVIDER_SITE_OTHER): Payer: BLUE CROSS/BLUE SHIELD | Admitting: Family Medicine

## 2016-04-04 ENCOUNTER — Encounter: Payer: Self-pay | Admitting: Family Medicine

## 2016-04-04 VITALS — BP 132/80 | HR 90 | Temp 98.7°F | Resp 18 | Wt 323.0 lb

## 2016-04-04 DIAGNOSIS — J40 Bronchitis, not specified as acute or chronic: Secondary | ICD-10-CM

## 2016-04-04 DIAGNOSIS — R509 Fever, unspecified: Secondary | ICD-10-CM | POA: Diagnosis not present

## 2016-04-04 DIAGNOSIS — J111 Influenza due to unidentified influenza virus with other respiratory manifestations: Secondary | ICD-10-CM | POA: Diagnosis not present

## 2016-04-04 MED ORDER — OSELTAMIVIR PHOSPHATE 75 MG PO CAPS: 75.0000 mg | ORAL_CAPSULE | Freq: Two times a day (BID) | ORAL | 0 refills | Status: DC

## 2016-04-04 MED ORDER — AZITHROMYCIN 250 MG PO TABS: ORAL_TABLET | ORAL | 0 refills | Status: DC

## 2016-04-04 NOTE — Progress Notes (Signed)
Subjective:  HPI Pt is here today because last night he was at the dinner table and started feeling bad then by the end of the night he had a fever from 101-102 and has had it on and off ever since. He reports that he also has body aches, chills, cough, congestion and feels the worst he's ever felt. He did not get a flu vaccine.  Prior to Admission medications   Medication Sig Start Date End Date Taking? Authorizing Provider  sertraline (ZOLOFT) 100 MG tablet TAKE 1 TABLET EVERY DAY 02/20/16  Yes Maple Hudsonichard L Randell Teare Jr., MD  buPROPion (WELLBUTRIN XL) 300 MG 24 hr tablet TAKE 1 TABLET EVERY DAY Patient not taking: Reported on 04/04/2016 03/11/15   Maple Hudsonichard L Treesa Mccully Jr., MD  naltrexone (DEPADE) 50 MG tablet Reported on 03/25/2015 01/10/15   Historical Provider, MD  Naltrexone (VIVITROL) 380 MG SUSR Inject into the muscle. Reported on 03/25/2015    Historical Provider, MD    Patient Active Problem List   Diagnosis Date Noted  . Anxiety 01/31/2015  . Major depressive disorder, single episode, moderate (HCC) 01/31/2015  . Drug abuse, opioid type 01/31/2015  . Opioid dependence in remission (HCC) 01/31/2015  . Panic attack 01/31/2015  . Posttraumatic stress disorder 01/31/2015  . Serotonergic syndrome 01/31/2015  . Seizure (HCC)   . Allergic rhinitis 05/20/2009  . Adiposity 05/20/2009    Past Medical History:  Diagnosis Date  . Depression   . Drug abuse   . Obesity   . Seizure [facility](HCC)     Social History   Social History  . Marital status: Single    Spouse name: N/A  . Number of children: 0  . Years of education: college   Occupational History  .      [employer]   Social History Main Topics  . Smoking status: Former Smoker    Quit date: 03/24/2014  . Smokeless tobacco: Never Used     Comment: Quit in 2016  . Alcohol use No     Comment: heavy drinker in the past  . Drug use: No     Comment: heroin use in the past. Completed rehab  . Sexual activity: Not on  file   Other Topics Concern  . Not on file   Social History Narrative   Patient lives at home with his parents. Patient works at [employer].    Education college.   Right handed.   Caffeine - None    Allergies  Allergen Reactions  . Penicillins     rash    Review of Systems  Constitutional: Positive for chills, fever and malaise/fatigue.  HENT: Positive for congestion and sore throat.   Eyes: Negative.   Respiratory: Positive for cough.   Cardiovascular: Negative.   Gastrointestinal: Positive for nausea.  Genitourinary: Negative.   Musculoskeletal: Positive for joint pain and myalgias.  Skin: Negative.   Neurological: Negative.   Endo/Heme/Allergies: Negative.   Psychiatric/Behavioral: Negative.      There is no immunization history on file for this patient.  Objective:  BP 132/80 (BP Location: Left Arm, Patient Position: Sitting, Cuff Size: Large)   Pulse 90   Temp 98.7 F (37.1 C) (Oral)   Resp 18   Wt (!) 323 lb (146.5 kg)   SpO2 97%   BMI 43.81 kg/m   Physical Exam  Constitutional: He is oriented to person, place, and time and well-developed, well-nourished, and in no distress.  HENT:  Head: Normocephalic and  atraumatic.  Right Ear: External ear normal.  Left Ear: External ear normal.  Nose: Nose normal.  Mouth/Throat: Oropharynx is clear and moist.  Eyes: Conjunctivae and EOM are normal. Pupils are equal, round, and reactive to light.  Neck: Normal range of motion. Neck supple.  Cardiovascular: Normal rate, regular rhythm, normal heart sounds and intact distal pulses.   Pulmonary/Chest: Effort normal. He has wheezes (mild inspirtory and expirtory wheezes in both bases).  Musculoskeletal: Normal range of motion.  Neurological: He is alert and oriented to person, place, and time. He has normal reflexes. Gait normal. GCS score is 15.  Skin: Skin is warm and dry.  Psychiatric: Mood, memory, affect and judgment normal.    Lab Results    Component Value Date   WBC 8.3 02/01/2015   HGB 15.7 09/30/2013   HCT 45.4 02/01/2015   PLT 353 02/01/2015   GLUCOSE 86 02/01/2015   TSH 0.807 02/01/2015    CMP     Component Value Date/Time   NA 140 02/01/2015 1435   NA 136 09/30/2013 1646   K 4.5 02/01/2015 1435   K 4.1 09/30/2013 1646   CL 99 02/01/2015 1435   CL 102 09/30/2013 1646   CO2 25 02/01/2015 1435   CO2 26 09/30/2013 1646   GLUCOSE 86 02/01/2015 1435   GLUCOSE 72 09/30/2013 1646   BUN 12 02/01/2015 1435   BUN 11 09/30/2013 1646   CREATININE 0.78 02/01/2015 1435   CREATININE 0.88 09/30/2013 1646   CALCIUM 9.9 02/01/2015 1435   CALCIUM 8.7 09/30/2013 1646   PROT 7.5 02/01/2015 1435   PROT 7.7 09/30/2013 1646   ALBUMIN 4.9 02/01/2015 1435   ALBUMIN 3.7 09/30/2013 1646   AST 16 02/01/2015 1435   AST 24 09/30/2013 1646   ALT 26 02/01/2015 1435   ALT 49 09/30/2013 1646   ALKPHOS 80 02/01/2015 1435   ALKPHOS 76 09/30/2013 1646   BILITOT 1.1 02/01/2015 1435   BILITOT 0.9 09/30/2013 1646   GFRNONAA 125 02/01/2015 1435   GFRNONAA >60 09/30/2013 1646   GFRAA 144 02/01/2015 1435   GFRAA >60 09/30/2013 1646    Assessment and Plan :  1. Fever, unspecified fever cause  - POC Influenza A&B(BINAX/QUICKVUE)  2. Influenza Flu test negative, however symptoms indicate flu, will treat.  - oseltamivir (TAMIFLU) 75 MG capsule; Take 1 capsule (75 mg total) by mouth 2 (two) times daily.  Dispense: 10 capsule; Refill: 0  3. Bronchitis  - azithromycin (ZITHROMAX) 250 MG tablet; Take 2 now then, take one daily.  Dispense: 6 tablet; Refill: 0   HPI, Exam, and A&P Transcribed under the direction and in the presence of Siddhanth Denk L. Wendelyn Breslow, MD  Electronically Signed: Silvio Pate, CMA I have done the exam and reviewed the above chart and it is accurate to the best of my knowledge. Dentist has been used in this note in any air is in the dictation or transcription are unintentional.  Julieanne Manson  MD [facility] 04/04/2016 3:23 PM

## 2016-05-08 LAB — POC INFLUENZA A&B (BINAX/QUICKVUE)
Influenza A, POC: NEGATIVE
Influenza B, POC: NEGATIVE

## 2016-08-07 ENCOUNTER — Encounter: Payer: Self-pay | Admitting: Family Medicine

## 2016-08-07 ENCOUNTER — Ambulatory Visit (INDEPENDENT_AMBULATORY_CARE_PROVIDER_SITE_OTHER): Payer: BLUE CROSS/BLUE SHIELD | Admitting: Family Medicine

## 2016-08-07 VITALS — BP 136/86 | HR 68 | Temp 98.6°F | Resp 16 | Wt 334.0 lb

## 2016-08-07 DIAGNOSIS — E669 Obesity, unspecified: Secondary | ICD-10-CM | POA: Diagnosis not present

## 2016-08-07 DIAGNOSIS — Z6841 Body Mass Index (BMI) 40.0 and over, adult: Secondary | ICD-10-CM | POA: Diagnosis not present

## 2016-08-07 DIAGNOSIS — F321 Major depressive disorder, single episode, moderate: Secondary | ICD-10-CM | POA: Diagnosis not present

## 2016-08-07 DIAGNOSIS — IMO0001 Reserved for inherently not codable concepts without codable children: Secondary | ICD-10-CM

## 2016-08-07 MED ORDER — PHENTERMINE HCL 37.5 MG PO CAPS: 37.5000 mg | ORAL_CAPSULE | ORAL | 1 refills | Status: DC

## 2016-08-07 NOTE — Patient Instructions (Addendum)
Wean Zoloft as follows.... Take every other day for 2 weeks , then every third day and stop July 1st.
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
## 2016-09-06 ENCOUNTER — Ambulatory Visit: Payer: BLUE CROSS/BLUE SHIELD | Admitting: Family Medicine

## 2020-07-31 ENCOUNTER — Other Ambulatory Visit: Payer: Self-pay

## 2020-07-31 ENCOUNTER — Encounter: Payer: Self-pay | Admitting: Emergency Medicine

## 2020-07-31 DIAGNOSIS — L0100 Impetigo, unspecified: Secondary | ICD-10-CM | POA: Insufficient documentation

## 2020-07-31 DIAGNOSIS — Z87891 Personal history of nicotine dependence: Secondary | ICD-10-CM | POA: Insufficient documentation

## 2020-07-31 DIAGNOSIS — R21 Rash and other nonspecific skin eruption: Secondary | ICD-10-CM | POA: Diagnosis present
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
## 2020-08-01 ENCOUNTER — Emergency Department
Admission: EM | Admit: 2020-08-01 | Discharge: 2020-08-01 | Disposition: A | Discharge status: Home / Self Care | Payer: BLUE CROSS/BLUE SHIELD | Attending: Emergency Medicine | Admitting: Emergency Medicine

## 2020-08-01 DIAGNOSIS — R21 Rash and other nonspecific skin eruption: Secondary | ICD-10-CM

## 2020-08-01 DIAGNOSIS — L0100 Impetigo, unspecified: Secondary | ICD-10-CM

## 2020-08-01 MED ORDER — MUPIROCIN 2 % EX OINT
TOPICAL_OINTMENT | Freq: Once | CUTANEOUS | Status: AC
  Filled 2020-08-01: qty 22

## 2020-08-01 MED ORDER — MUPIROCIN CALCIUM 2 % EX CREA
TOPICAL_CREAM | Freq: Once | CUTANEOUS | Status: DC
  Filled 2020-08-01 (×2): qty 15

## 2020-08-01 NOTE — ED Provider Notes (Signed)
[facility] Emergency Department Provider Note  ____________________________________________  Time seen: Approximately 1:36 AM  I have reviewed the triage vital signs and the nursing notes.   HISTORY  Chief Complaint Rash   HPI Phillip Moss is a 30 y.o. male who presents for evaluation of a rash.  Patient noticed for marks to in each leg yesterday.  He thought initially they were spider bites but later saw some Google images and was concerned that he may have a staph infection.  He is concerned because he has hardware in his left lower leg from prior surgery.  He reports that if you push on them that slightly painful but otherwise no pain, no fever or chills.  The rash has not changed in size since yesterday.   Past Medical History:  Diagnosis Date  . Depression   . Drug abuse (HCC)   . Obesity   . Seizure [facility])     Patient Active Problem List   Diagnosis Date Noted  . Anxiety 01/31/2015  . Major depressive disorder, single episode, moderate (HCC) 01/31/2015  . Drug abuse, opioid type (HCC) 01/31/2015  . Opioid dependence in remission (HCC) 01/31/2015  . Panic attack 01/31/2015  . Posttraumatic stress disorder 01/31/2015  . Serotonergic syndrome 01/31/2015  . Seizure (HCC)   . Allergic rhinitis 05/20/2009  . Adiposity 05/20/2009    Past Surgical History:  Procedure Laterality Date  . COLONOSCOPY WITH PROPOFOL N/A 03/25/2015   Procedure: COLONOSCOPY WITH PROPOFOL;  Surgeon: Elnita Maxwell, MD;  Location: [facility] ENDOSCOPY;  Service: Endoscopy;  Laterality: N/A;  . LEG SURGERY     rod put in in the left leg after a fracture  . plate in shoulder Left   . rod in leg Left   . SHOULDER SURGERY     plate put in after fracture-left  . TONSILLECTOMY      Prior to Admission medications   Medication Sig Start Date End Date Taking? Authorizing Provider  naltrexone (DEPADE) 50 MG tablet Reported on 03/25/2015 01/10/15   [provider]  Naltrexone (VIVITROL) 380 MG SUSR Inject into the muscle. Reported on 03/25/2015    [provider]  phentermine 37.5 MG capsule Take 1 capsule (37.5 mg total) by mouth every morning. 08/07/16   Maple Hudson., MD  sertraline (ZOLOFT) 100 MG tablet TAKE 1 TABLET EVERY DAY 02/20/16   Maple Hudson., MD    Allergies Penicillins  Family History  Problem Relation Age of Onset  . High blood pressure Mother   . Hypertension Mother   . Hyperlipidemia Father     Social History Social History   Tobacco Use  . Smoking status: Former Smoker    Quit date: 03/24/2014    Years since quitting: 6.3  . Smokeless tobacco: Never Used  . Tobacco comment: Quit in 2016  Vaping Use  . Vaping Use: Never used  Substance Use Topics  . Alcohol use: No    Alcohol/week: 3.0 - 4.0 standard drinks    Types: 3 - 4 Standard drinks or equivalent per week    Comment: heavy drinker in the past  . Drug use: No    Types: Heroin    Comment: heroin use in the past. Completed rehab    Review of Systems  Constitutional: Negative for fever. Eyes: Negative for visual changes. ENT: Negative for sore throat. Neck: No neck pain  Cardiovascular: Negative for chest pain. Respiratory: Negative for shortness of breath. Gastrointestinal:  Negative for abdominal pain, vomiting or diarrhea. Genitourinary: Negative for dysuria. Musculoskeletal: Negative for back pain. Skin: + rash. Neurological: Negative for headaches, weakness or numbness. Psych: No SI or HI  ____________________________________________   PHYSICAL EXAM:  VITAL SIGNS: ED Triage Vitals  Enc Vitals Group     BP 07/31/20 2246 (!) 142/95     Pulse Rate 07/31/20 2244 91     Resp 07/31/20 2244 20     Temp 07/31/20 2244 97.7 F (36.5 C)     Temp Source 07/31/20 2244 Oral     SpO2 --      Weight 07/31/20 2245 (!) 307 lb (139.3 kg)     Height 07/31/20 2245 5\' 11"  (1.803 m)     Head Circumference --      Peak Flow --       Pain Score 07/31/20 2245 0     Pain Loc --      Pain Edu? --      Excl. in GC? --     Constitutional: Alert and oriented. Well appearing and in no apparent distress. HEENT:      Head: Normocephalic and atraumatic.         Eyes: Conjunctivae are normal. Sclera is non-icteric.       Mouth/Throat: Mucous membranes are moist.       Neck: Supple with no signs of meningismus. Cardiovascular: Regular rate and rhythm.  Respiratory: Normal respiratory effort.  Musculoskeletal:  Two areas in each leg, papules with central crusting and surrounding non blanching not warmth erythema. No palms or soles involvement Neurologic: Normal speech and language. Face is symmetric. Moving all extremities. No gross focal neurologic deficits are appreciated. Skin: Skin is warm, dry and intact. No rash noted. Psychiatric: Mood and affect are normal. Speech and behavior are normal.         ____________________________________________   LABS (all labs ordered are listed, but only abnormal results are displayed)  Labs Reviewed - No data to display ____________________________________________  EKG  none  ____________________________________________  RADIOLOGY  none  ____________________________________________   PROCEDURES  Procedure(s) performed: None Procedures Critical Care performed:  None ____________________________________________   INITIAL IMPRESSION / ASSESSMENT AND PLAN / ED COURSE  30 y.o. male who presents for evaluation of a rash.  Please see exam for picture and description of the rash.  Most likely an insect bite but does have some crusting in a few of them therefore will treat with topical mupirocin.  They are not warm to the touch.  There is no systemic symptoms, no involvement of palms or soles.  Discussed supportive care, topical antibiotic and follow-up with PCP.  Discussed my standard return precautions      _____________________________________________ Please  note:  Patient was evaluated in Emergency Department today for the symptoms described in the history of present illness. Patient was evaluated in the context of the global COVID-19 pandemic, which necessitated consideration that the patient might be at risk for infection with the SARS-CoV-2 virus that causes COVID-19. Institutional protocols and algorithms that pertain to the evaluation of patients at risk for COVID-19 are in a state of rapid change based on information released by regulatory bodies including the CDC and federal and state organizations. These policies and algorithms were followed during the patient's care in the ED.  Some ED evaluations and interventions may be delayed as a result of limited staffing during the pandemic.   [place] Controlled Substance Database was reviewed by me. ____________________________________________   FINAL CLINICAL IMPRESSION(S) /  ED DIAGNOSES   Final diagnoses:  Rash  Impetigo      NEW MEDICATIONS STARTED DURING THIS VISIT:  ED Discharge Orders    None       Note:  This document was prepared using Dragon voice recognition software and may include unintentional dictation errors.    Don Perking, [place], MD 08/01/20 [phone]

## 2020-08-01 NOTE — ED Notes (Signed)
Pharmacy contacted again at this time for bactrban

## 2020-08-01 NOTE — Discharge Instructions (Addendum)
Keep the area dry and clean.  Wash with warm water and soap.  Apply topical mupirocin twice a day for the next 7 days.  Follow-up with your doctor in a couple of days.  Return to the emergency room if the rash is getting bigger, hot to the touch, more painful, or if you develop fever, or if have new lesions appearing.

## 2021-10-10 ENCOUNTER — Telehealth: Payer: Self-pay | Admitting: Internal Medicine

## 2021-10-10 ENCOUNTER — Telehealth: Payer: Self-pay | Admitting: Family Medicine

## 2021-10-10 NOTE — Telephone Encounter (Signed)
Lm to call office to schedule new patient appointment with Dr Lorin Picket.

## 2021-12-27 ENCOUNTER — Ambulatory Visit (INDEPENDENT_AMBULATORY_CARE_PROVIDER_SITE_OTHER): Payer: BLUE CROSS/BLUE SHIELD | Admitting: Internal Medicine

## 2021-12-27 ENCOUNTER — Encounter: Payer: Self-pay | Admitting: Internal Medicine

## 2021-12-27 VITALS — BP 120/64 | HR 62 | Temp 98.0°F | Ht 71.46 in | Wt 329.2 lb

## 2021-12-27 DIAGNOSIS — F321 Major depressive disorder, single episode, moderate: Secondary | ICD-10-CM

## 2021-12-27 DIAGNOSIS — Z13220 Encounter for screening for lipoid disorders: Secondary | ICD-10-CM | POA: Diagnosis not present

## 2021-12-27 DIAGNOSIS — R5383 Other fatigue: Secondary | ICD-10-CM | POA: Diagnosis not present

## 2021-12-27 DIAGNOSIS — F419 Anxiety disorder, unspecified: Secondary | ICD-10-CM

## 2021-12-27 DIAGNOSIS — R198 Other specified symptoms and signs involving the digestive system and abdomen: Secondary | ICD-10-CM

## 2021-12-27 DIAGNOSIS — F1121 Opioid dependence, in remission: Secondary | ICD-10-CM | POA: Diagnosis not present

## 2021-12-27 LAB — COMPREHENSIVE METABOLIC PANEL
ALT: 33 U/L (ref 0–53)
AST: 19 U/L (ref 0–37)
Albumin: 4.6 g/dL (ref 3.5–5.2)
Alkaline Phosphatase: 62 U/L (ref 39–117)
BUN: 12 mg/dL (ref 6–23)
CO2: 28 mEq/L (ref 19–32)
Calcium: 9.4 mg/dL (ref 8.4–10.5)
Chloride: 104 mEq/L (ref 96–112)
Creatinine, Ser: 0.76 mg/dL (ref 0.40–1.50)
GFR: 118.12 mL/min (ref >60.00)
Glucose, Bld: 94 mg/dL (ref 70–99)
Potassium: 4.5 mEq/L (ref 3.5–5.1)
Sodium: 138 mEq/L (ref 135–145)
Total Bilirubin: 0.9 mg/dL (ref 0.2–1.2)
Total Protein: 7 g/dL (ref 6.0–8.3)

## 2021-12-27 LAB — CBC WITH DIFFERENTIAL/PLATELET
Basophils Absolute: 0.1 10*3/uL (ref 0.0–0.1)
Basophils Relative: 1 % (ref 0.0–3.0)
Eosinophils Absolute: 0.1 10*3/uL (ref 0.0–0.7)
Eosinophils Relative: 2.2 % (ref 0.0–5.0)
HCT: 44.8 % (ref 39.0–52.0)
Hemoglobin: 15.5 g/dL (ref 13.0–17.0)
Lymphocytes Relative: 28 % (ref 12.0–46.0)
Lymphs Abs: 1.8 10*3/uL (ref 0.7–4.0)
MCHC: 34.5 g/dL (ref 30.0–36.0)
MCV: 90.2 fl (ref 78.0–100.0)
Monocytes Absolute: 0.4 10*3/uL (ref 0.1–1.0)
Monocytes Relative: 5.4 % (ref 3.0–12.0)
Neutro Abs: 4.2 10*3/uL (ref 1.4–7.7)
Neutrophils Relative %: 63.4 % (ref 43.0–77.0)
Platelets: 269 10*3/uL (ref 150.0–400.0)
RBC: 4.96 Mil/uL (ref 4.22–5.81)
RDW: 13.5 % (ref 11.5–15.5)
WBC: 6.6 10*3/uL (ref 4.0–10.5)

## 2021-12-27 LAB — TESTOSTERONE: Testosterone: 246.11 ng/dL — ABNORMAL LOW (ref 300.00–890.00)

## 2021-12-27 LAB — LIPID PANEL
Cholesterol: 138 mg/dL (ref 0–200)
HDL: 41.6 mg/dL (ref >39.00)
LDL Cholesterol: 77 mg/dL (ref 0–99)
NonHDL: 96.77
Total CHOL/HDL Ratio: 3
Triglycerides: 101 mg/dL (ref 0.0–149.0)
VLDL: 20.2 mg/dL (ref 0.0–40.0)

## 2021-12-27 LAB — TSH: TSH: 1.16 u[IU]/mL (ref 0.35–5.50)
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
## 2021-12-29 ENCOUNTER — Other Ambulatory Visit: Payer: Self-pay

## 2021-12-29 DIAGNOSIS — R7989 Other specified abnormal findings of blood chemistry: Secondary | ICD-10-CM

## 2021-12-31 ENCOUNTER — Encounter: Payer: Self-pay | Admitting: Internal Medicine

## 2021-12-31 DIAGNOSIS — R198 Other specified symptoms and signs involving the digestive system and abdomen: Secondary | ICD-10-CM | POA: Insufficient documentation

## 2021-12-31 NOTE — Assessment & Plan Note (Signed)
Request testosterone level to be checked.  Also check cbc, met c and tsh.  Exercise.

## 2021-12-31 NOTE — Assessment & Plan Note (Signed)
Being followed by psychiatry.  On wellbutrin and pristiq.  Follow.

## 2021-12-31 NOTE — Assessment & Plan Note (Signed)
Soft stool,  Intermittent abdominal pain and diarrhea.  Benefiber.  Monitor diet.  Follow.

## 2021-12-31 NOTE — Assessment & Plan Note (Signed)
No opiates since 2015.  Seeing psychiatry.  Doing well.  Follow. 

## 2021-12-31 NOTE — Assessment & Plan Note (Signed)
Being followed by psychiatry.  Seeing regularly.  Continues on pristiq and wellbutrin.  Follow.  

## 2022-01-03 ENCOUNTER — Encounter: Payer: Self-pay | Admitting: Internal Medicine

## 2022-01-03 DIAGNOSIS — R7989 Other specified abnormal findings of blood chemistry: Secondary | ICD-10-CM

## 2022-01-03 NOTE — Telephone Encounter (Signed)
Order placed for urology referral to [facility] urology.

## 2022-01-18 NOTE — Telephone Encounter (Signed)
Order placed for urology referral.  

## 2022-03-19 ENCOUNTER — Encounter: Payer: Self-pay | Admitting: Urology

## 2022-03-19 ENCOUNTER — Ambulatory Visit: Payer: 59 | Admitting: Urology

## 2022-03-19 VITALS — BP 157/104 | HR 69 | Ht 71.0 in | Wt 330.0 lb

## 2022-03-19 DIAGNOSIS — E291 Testicular hypofunction: Secondary | ICD-10-CM

## 2022-03-19 DIAGNOSIS — R7989 Other specified abnormal findings of blood chemistry: Secondary | ICD-10-CM | POA: Diagnosis not present

## 2022-03-19 NOTE — Progress Notes (Signed)
03/19/2022 10:00 AM   Phillip Moss 07-Dec-1990 [id number]  Referring provider: Dale Gowanda, MD [address] [place],  [place] [address]  Chief Complaint  Patient presents with   Hypogonadism    HPI: Phillip Moss is a 32 y.o. male referred for evaluation of hypogonadism.  Recently established care with Dr. Lorin Picket and requested a testosterone level secondary to fatigue which was low at 246 ng/dL States his fatigue is mild.  Denies ED, decreased libido.   No bothersome lower urinary tract symptoms   PMH: Past Medical History:  Diagnosis Date   Depression    Drug abuse (H[facility])    Obesity    Seizure [facility])     Surgical History: Past Surgical History:  Procedure Laterality Date   COLONOSCOPY WITH PROPOFOL N/A 03/25/2015   Procedure: COLONOSCOPY WITH PROPOFOL;  Surgeon: Elnita Maxwell, MD;  Location: [facility] ENDOSCOPY;  Service: Endoscopy;  Laterality: N/A;   LEG SURGERY     rod put in in the left leg after a fracture   plate in shoulder Left    rod in leg Left    SHOULDER SURGERY     plate put in after fracture-left   TONSILLECTOMY      Home Medications:  Allergies as of 03/19/2022       Reactions   Penicillins    rash        Medication List        Accurate as of March 19, 2022 10:00 AM. If you have any questions, ask your nurse or doctor.          buPROPion 150 MG 24 hr tablet Commonly known as: WELLBUTRIN XL Take 150 mg by mouth daily.   desvenlafaxine 100 MG 24 hr tablet Commonly known as: PRISTIQ Take 100 mg by mouth daily.   gabapentin 600 MG tablet Commonly known as: NEURONTIN Take 1 tablet by mouth 3 (three) times daily.        Allergies:  Allergies  Allergen Reactions   Penicillins     rash    Family History: Family History  Problem Relation Age of Onset   High blood pressure Mother    Hypertension Mother    Hyperlipidemia Father     Social History:  reports that he quit smoking about 7  years ago. His smoking use included cigarettes. He has never used smokeless tobacco. He reports that he does not drink alcohol and does not use drugs.   Physical Exam: BP (!) 157/104   Pulse 69   Ht 5\' 11"  (1.803 m)   Wt (!) 330 lb (149.7 kg)   BMI 46.03 kg/m   Constitutional:  Alert and oriented, No acute distress. HEENT:  AT Respiratory: Normal respiratory effort, no increased work of breathing. GU: Testes descended bilaterally without masses or tenderness.  Estimated volume >20 cc bilaterally Skin: No rashes, bruises or suspicious lesions. Psychiatric: Normal mood and affect.  Laboratory Data:  Lab Results  Component Value Date   TESTOSTERONE 246.11 (L) 12/27/2021    Assessment & Plan:    1. Low testosterone We discussed the diagnosis of testosterone is based on 2 abnormal total testosterone levels drawn in the a.m. admitted with signs and symptoms of low testosterone. We discussed various forms of testosterone placement including topical preparations, intramuscular injections, subcutaneous injections, subcutaneous pellet implantation and oral testosterone.  Pros and cons of each form were discussed.  The risk of transference of topical testosterone. I had an extensive discussion  regarding testosterone replacement therapy including the following: Treatment may result in improvements in erectile function, low sex drive, anemia, bone mineral density, lean body mass, and depressive symptoms; evidence is inconclusive whether testosterone therapy improves cognitive function, measures of diabetes, energy, fatigue, lipid profiles, and quality of life measures; there is no conclusive evidence linking testosterone therapy to the development of prostate cancer; there is no definitive evidence linking testosterone therapy to a higher incidence of venothrombolic events; at the present time it cannot be stated definitively whether testosterone therapy increases or decreases the risk of  cardiovascular events including myocardial infarction and stroke. Potential side effects were discussed including erythrocytosis, gynecomastia.  The need for regular monitoring of testosterone levels and hematocrit was discussed. Repeat testosterone level, LH and prolactin ordered We discussed TRT will significantly lower the sperm count and in most cases this will resolve once discontinued.  The off able use of Clomid was discussed which will preserve the sperm count If he elects to proceed with TRT he would be most interested in a topical gel   Abbie Sons, [place] [address] [place], [place] [address] [phone]

## 2022-03-20 ENCOUNTER — Encounter: Payer: Self-pay | Admitting: Urology

## 2022-03-20 LAB — TESTOSTERONE: Testosterone: 302 ng/dL (ref 264–916)

## 2022-03-20 LAB — PROLACTIN: Prolactin: 5.2 ng/mL (ref 3.9–22.7)

## 2022-03-20 LAB — LUTEINIZING HORMONE: LH: 3.8 m[IU]/mL (ref 1.7–8.6)

## 2022-04-03 ENCOUNTER — Encounter: Payer: Self-pay | Admitting: Internal Medicine

## 2022-04-03 ENCOUNTER — Ambulatory Visit (INDEPENDENT_AMBULATORY_CARE_PROVIDER_SITE_OTHER): Payer: 59

## 2022-04-03 ENCOUNTER — Ambulatory Visit (INDEPENDENT_AMBULATORY_CARE_PROVIDER_SITE_OTHER): Payer: 59 | Admitting: Internal Medicine

## 2022-04-03 VITALS — BP 138/80 | HR 85 | Temp 97.8°F | Resp 16 | Ht 71.5 in | Wt 323.0 lb

## 2022-04-03 DIAGNOSIS — F419 Anxiety disorder, unspecified: Secondary | ICD-10-CM

## 2022-04-03 DIAGNOSIS — R69 Illness, unspecified: Secondary | ICD-10-CM | POA: Diagnosis not present

## 2022-04-03 DIAGNOSIS — Z0000 Encounter for general adult medical examination without abnormal findings: Secondary | ICD-10-CM

## 2022-04-03 DIAGNOSIS — R059 Cough, unspecified: Secondary | ICD-10-CM

## 2022-04-03 DIAGNOSIS — F1121 Opioid dependence, in remission: Secondary | ICD-10-CM

## 2022-04-03 DIAGNOSIS — F321 Major depressive disorder, single episode, moderate: Secondary | ICD-10-CM

## 2022-04-03 DIAGNOSIS — R0602 Shortness of breath: Secondary | ICD-10-CM

## 2022-04-03 MED ORDER — PREDNISONE 10 MG PO TABS: ORAL_TABLET | ORAL | 0 refills | Status: DC

## 2022-04-03 NOTE — Progress Notes (Signed)
Subjective:    Patient ID: Phillip Moss, male    DOB: 1990/07/14, 32 y.o.   MRN: [id number]  Patient here for  Chief Complaint  Patient presents with   Annual Exam    HPI Here for physical exam. Reports he is doing relatively well.  Does report increased chest congestion.  Started approximately 10 days ago.  Fever initially.  Increased cough - productive - green mucus.  Increased chest congestion.  Quit smoking maruquana - one week ago.  Off cigarettes - several months.  Discussed acid reflux.  Lying down.  No vomiting.  No abdominal pain reported.  No bowel change reported.  Continues f/u with psych.  Doing well on current medication regimen.  Continues leg pain as outlined previous note.    Past Medical History:  Diagnosis Date   Depression    Drug abuse ([place])    Obesity    Seizure [facility])    Past Surgical History:  Procedure Laterality Date   COLONOSCOPY WITH PROPOFOL N/A 03/25/2015   Procedure: COLONOSCOPY WITH PROPOFOL;  Surgeon: Josefine Class, MD;  Location: [facility] ENDOSCOPY;  Service: Endoscopy;  Laterality: N/A;   LEG SURGERY     rod put in in the left leg after a fracture   plate in shoulder Left    rod in leg Left    SHOULDER SURGERY     plate put in after fracture-left   TONSILLECTOMY     Family History  Problem Relation Age of Onset   High blood pressure Mother    Hypertension Mother    Hyperlipidemia Father    Social History   Socioeconomic History   Marital status: Single    Spouse name: Not on file   Number of children: 0   Years of education: college   Highest education level: Not on file  Occupational History    Comment: [employer]  Tobacco Use   Smoking status: Former    Types: Cigarettes    Quit date: 03/24/2014    Years since quitting: 8.0   Smokeless tobacco: Never   Tobacco comments:    Quit in 2016  Vaping Use   Vaping Use: Never used  Substance and Sexual Activity   Alcohol use: No    Alcohol/week: 3.0 - 4.0  standard drinks of alcohol    Types: 3 - 4 Standard drinks or equivalent per week    Comment: heavy drinker in the past   Drug use: No    Types: Heroin    Comment: heroin use in the past. Completed rehab   Sexual activity: Not on file  Other Topics Concern   Not on file  Social History Narrative   Patient lives at home with his parents. Patient works at [employer].    Education college.   Right handed.   Caffeine - None   Social Determinants of Health   Financial Resource Strain: Not on file  Food Insecurity: Not on file  Transportation Needs: Not on file  Physical Activity: Not on file  Stress: Not on file  Social Connections: Not on file     Review of Systems  Constitutional:  Negative for appetite change and unexpected weight change.  HENT:  Positive for congestion. Negative for sinus pressure and sore throat.   Eyes:  Negative for pain and visual disturbance.  Respiratory:  Positive for cough. Negative for chest tightness and shortness of breath.   Cardiovascular:  Negative for chest pain and palpitations.  Gastrointestinal:  Negative for abdominal pain, diarrhea, nausea and vomiting.  Genitourinary:  Negative for difficulty urinating and dysuria.  Musculoskeletal:  Negative for joint swelling and myalgias.  Skin:  Negative for color change and rash.  Neurological:  Negative for dizziness and headaches.  Hematological:  Negative for adenopathy. Does not bruise/bleed easily.  Psychiatric/Behavioral:  Negative for agitation and dysphoric mood.        Objective:     BP 138/80   Pulse 85   Temp 97.8 F (36.6 C)   Resp 16   Ht 5' 11.5" (1.816 m)   Wt (!) 323 lb (146.5 kg)   SpO2 98%   BMI 44.42 kg/m  Wt Readings from Last 3 Encounters:  04/03/22 (!) 323 lb (146.5 kg)  03/19/22 (!) 330 lb (149.7 kg)  12/27/21 (!) 329 lb 3.2 oz (149.3 kg)    Physical Exam Constitutional:      General: He is not in acute distress.    Appearance: Normal  appearance. He is well-developed.  HENT:     Head: Normocephalic and atraumatic.     Right Ear: External ear normal.     Left Ear: External ear normal.  Eyes:     General: No scleral icterus.       Right eye: No discharge.        Left eye: No discharge.     Conjunctiva/sclera: Conjunctivae normal.  Neck:     Thyroid: No thyromegaly.  Cardiovascular:     Rate and Rhythm: Normal rate and regular rhythm.  Pulmonary:     Effort: No respiratory distress.     Breath sounds: Normal breath sounds. No wheezing.  Abdominal:     General: Bowel sounds are normal.     Palpations: Abdomen is soft.     Tenderness: There is no abdominal tenderness.  Musculoskeletal:        General: No swelling or tenderness.     Cervical back: Neck supple. No tenderness.  Lymphadenopathy:     Cervical: No cervical adenopathy.  Skin:    Findings: No erythema or rash.  Neurological:     Mental Status: He is alert and oriented to person, place, and time.  Psychiatric:        Mood and Affect: Mood normal.        Behavior: Behavior normal.      Outpatient Encounter Medications as of 04/03/2022  Medication Sig   predniSONE (DELTASONE) 10 MG tablet Take 4 tablets x 1 day and then decrease by 1/2 tablet per day until down to zero mg.   buPROPion (WELLBUTRIN XL) 150 MG 24 hr tablet Take 150 mg by mouth daily.   desvenlafaxine (PRISTIQ) 100 MG 24 hr tablet Take 100 mg by mouth daily.   gabapentin (NEURONTIN) 600 MG tablet Take 1 tablet by mouth 3 (three) times daily.   No facility-administered encounter medications on file as of 04/03/2022.     Lab Results  Component Value Date   WBC 6.6 12/27/2021   HGB 15.5 12/27/2021   HCT 44.8 12/27/2021   PLT 269.0 12/27/2021   GLUCOSE 94 12/27/2021   CHOL 138 12/27/2021   TRIG 101.0 12/27/2021   HDL 41.60 12/27/2021   LDLCALC 77 12/27/2021   ALT 33 12/27/2021   AST 19 12/27/2021   NA 138 12/27/2021   K 4.5 12/27/2021   CL 104 12/27/2021   CREATININE 0.76  12/27/2021   BUN 12 12/27/2021   CO2 28 12/27/2021   TSH 1.16 12/27/2021  No results found.     Assessment & Plan:  Routine general medical examination at a health care facility  SOB (shortness of breath) on exertion Assessment & Plan: Increased cough and congestion as outlined.  Also report increased sob with exertion.  Treat respiratory infection as outlined.  Given risk factors and sob with exertion, EKG - SR with no acute ischemic changes noted.  Discussed further cardiac evaluation.  Agreeable for referral.    Orders: -     EKG 12-Lead -     DG Chest 2 View; Future -     Ambulatory referral to Cardiology  Anxiety Assessment & Plan: Being followed by psychiatry.  On wellbutrin and pristiq.  Stable. Follow.    Major depressive disorder, single episode, moderate ([facility]) Assessment & Plan: Being followed by psychiatry.  Seeing regularly.  Continues on pristiq and wellbutrin.  Follow.    Opioid dependence in remission [facility]) Assessment & Plan: No opiates since 2015.  Seeing psychiatry.  Doing well.  Follow.   Cough, unspecified type Assessment & Plan: Increased cough and congestion as outlined.  Persistent.  Check cxr.  Prednisone taper as directed.  Robitussin DM.  Pepcid to treat acid reflux.  Hold abx until review cxr.  Follow.  Call with update.     Health care maintenance Assessment & Plan: Physical today 04/03/22.    Other orders -     predniSONE; Take 4 tablets x 1 day and then decrease by 1/2 tablet per day until down to zero mg.  Dispense: 18 tablet; Refill: 0     [facility], MD

## 2022-04-03 NOTE — Patient Instructions (Addendum)
Pepcid (famotidine) - 20mg  - take one tablet 30 minutes before breakfast or before your evening meal.   Robitussin DM twice a day as needed.

## 2022-04-07 ENCOUNTER — Encounter: Payer: Self-pay | Admitting: Internal Medicine

## 2022-04-07 DIAGNOSIS — R059 Cough, unspecified: Secondary | ICD-10-CM | POA: Insufficient documentation

## 2022-04-07 DIAGNOSIS — Z0000 Encounter for general adult medical examination without abnormal findings: Secondary | ICD-10-CM | POA: Insufficient documentation

## 2022-04-07 NOTE — Assessment & Plan Note (Signed)
No opiates since 2015.  Seeing psychiatry.  Doing well.  Follow.

## 2022-04-07 NOTE — Assessment & Plan Note (Signed)
Increased cough and congestion as outlined.  Also report increased sob with exertion.  Treat respiratory infection as outlined.  Given risk factors and sob with exertion, EKG - SR with no acute ischemic changes noted.  Discussed further cardiac evaluation.  Agreeable for referral.

## 2022-04-07 NOTE — Assessment & Plan Note (Signed)
Being followed by psychiatry.  On wellbutrin and pristiq.  Stable. Follow.

## 2022-04-07 NOTE — Assessment & Plan Note (Signed)
Physical today 04/03/22.

## 2022-04-07 NOTE — Assessment & Plan Note (Signed)
Being followed by psychiatry.  Seeing regularly.  Continues on pristiq and wellbutrin.  Follow.

## 2022-04-07 NOTE — Assessment & Plan Note (Signed)
Increased cough and congestion as outlined.  Persistent.  Check cxr.  Prednisone taper as directed.  Robitussin DM.  Pepcid to treat acid reflux.  Hold abx until review cxr.  Follow.  Call with update.

## 2022-05-28 NOTE — Progress Notes (Unsigned)
Cardiology Office Note  Date:  05/29/2022   ID:  Phillip Moss, DOB Jul 15, 1990, MRN [id number]  PCP:  Phillip Pheasant, MD   Chief Complaint  Patient presents with   New Patient (Initial Visit)    Ref by Dr. Einar Moss for shortness of breath on exertion. Medications reviewed by the patient verbally.     HPI:  Mr. Phillip Moss is a 32 year old gentleman with past medical history of Obesity Hypotestosterone Prior smoking history age 62 to 24 Who presents by referral from Dr. Einar Moss for shortness of breath on exertion  Seen by primary care April 03, 2022 after URI Had some short of breath  SOB resolved, feels close to normal Feels his shortness of breath was secondary to viral URI in January Denies significant leg swelling abdominal distention concerning for CHF  BP elevated on recent visit with urology Blood pressure elevated on today's visit, even on my recheck Currently not on blood for medication Has not been checking blood pressure at home  No regular exercise program  EKG personally reviewed by myself on todays visit Normal sinus rhythm rate 79 bpm no significant ST-T wave changes  PMH:   has a past medical history of Depression, Drug abuse ([place]), Obesity, and Seizure ([place]).  PSH:    Past Surgical History:  Procedure Laterality Date   COLONOSCOPY WITH PROPOFOL N/A 03/25/2015   Procedure: COLONOSCOPY WITH PROPOFOL;  Surgeon: Phillip Class, MD;  Location: [facility] ENDOSCOPY;  Service: Endoscopy;  Laterality: N/A;   LEG SURGERY     rod put in in the left leg after a fracture   plate in shoulder Left    rod in leg Left    SHOULDER SURGERY     plate put in after fracture-left   TONSILLECTOMY      Current Outpatient Medications  Medication Sig Dispense Refill   buPROPion (WELLBUTRIN XL) 150 MG 24 hr tablet Take 150 mg by mouth daily.     clobetasol cream (TEMOVATE) AB-123456789 % Apply 1 Application topically 2 (two) times daily.     desvenlafaxine  (PRISTIQ) 100 MG 24 hr tablet Take 100 mg by mouth daily.     gabapentin (NEURONTIN) 600 MG tablet Take 1 tablet by mouth 3 (three) times daily.     No current facility-administered medications for this visit.     Allergies:   Penicillins   Social History:  The patient  reports that he quit smoking about 8 years ago. His smoking use included cigarettes. He has a 12.00 pack-year smoking history. He has never used smokeless tobacco. He reports that he does not drink alcohol and does not use drugs.   Family History:   family history includes High blood pressure in his mother; Hyperlipidemia in his father; Hypertension in his mother.    Review of Systems: Review of Systems  Constitutional: Negative.   HENT: Negative.    Respiratory: Negative.    Cardiovascular: Negative.   Gastrointestinal: Negative.   Musculoskeletal: Negative.   Neurological: Negative.   Psychiatric/Behavioral: Negative.    All other systems reviewed and are negative.    PHYSICAL EXAM: VS:  BP (!) 145/90 (BP Location: Left Arm)   Pulse 79   Ht 5\' 11"  (1.803 m)   Wt (!) 338 lb 6 oz (153.5 kg)   SpO2 97%   BMI 47.19 kg/m  , BMI Body mass index is 47.19 kg/m. GEN: Well nourished, well developed, in no acute distress HEENT: normal Neck: no JVD, carotid bruits,  or masses Cardiac: RRR; no murmurs, rubs, or gallops,no edema  Respiratory:  clear to auscultation bilaterally, normal work of breathing GI: soft, nontender, nondistended, + BS MS: no deformity or atrophy Skin: warm and dry, no rash Neuro:  Strength and sensation are intact Psych: euthymic mood, full affect  Recent Labs: 12/27/2021: ALT 33; BUN 12; Creatinine, Ser 0.76; Hemoglobin 15.5; Platelets 269.0; Potassium 4.5; Sodium 138; TSH 1.16    Lipid Panel Lab Results  Component Value Date   CHOL 138 12/27/2021   HDL 41.60 12/27/2021   LDLCALC 77 12/27/2021   TRIG 101.0 12/27/2021      Wt Readings from Last 3 Encounters:  05/29/22 (!) 338  lb 6 oz (153.5 kg)  04/03/22 (!) 323 lb (146.5 kg)  03/19/22 (!) 330 lb (149.7 kg)       ASSESSMENT AND PLAN:  Problem List Items Addressed This Visit   None Visit Diagnoses     Shortness of breath    -  Primary   Relevant Orders   EKG 12-Lead   Essential hypertension       Morbid obesity ([place])          Shortness of breath Reports having viral URI in January, lungs have since cleared out, feels back to his baseline On ambulation no limitations No regular exercise program Stressed the importance of monitoring his blood pressure He would like to hold off on further testing, echo has not been ordered Recommend if shortness of breath comes back he call our office, echocardiogram could be performed  Essential hypertension Blood pressure elevated with urology, elevated today Recommend he closely monitor blood pressure at home and call our office or Dr. Nicki Moss with blood pressure numbers Medication might be needed We would encouraged exercise, careful diet management in an effort to lose weight.     Total encounter time more than 50 minutes  Greater than 50% was spent in counseling and coordination of care with the patient    Signed, Phillip Moss, M.D., Ph.D. [place], [place]

## 2022-05-29 ENCOUNTER — Ambulatory Visit: Payer: 59 | Attending: Cardiovascular Disease | Admitting: Cardiovascular Disease

## 2022-05-29 ENCOUNTER — Encounter: Payer: Self-pay | Admitting: Cardiovascular Disease

## 2022-05-29 VITALS — BP 145/90 | HR 79 | Ht 71.0 in | Wt 338.4 lb

## 2022-05-29 DIAGNOSIS — R0602 Shortness of breath: Secondary | ICD-10-CM | POA: Diagnosis not present

## 2022-05-29 DIAGNOSIS — I10 Essential (primary) hypertension: Secondary | ICD-10-CM | POA: Diagnosis not present

## 2022-05-29 NOTE — Patient Instructions (Signed)
Please call if you would like an echocardiogram for shortness of breath  Monitor blood pressure Goal 130/<90  Medication Instructions:  No changes  If you need a refill on your cardiac medications before your next appointment, please call your pharmacy.   Lab work: No new labs needed  Testing/Procedures: No new testing needed  Follow-Up: At [facility], you and your health needs are our priority.  As part of our continuing mission to provide you with exceptional heart care, we have created designated Provider Care Teams.  These Care Teams include your primary Cardiologist (physician) and Advanced Practice Providers (APPs -  Physician Assistants and Nurse Practitioners) who all work together to provide you with the care you need, when you need it.  You will need a follow up appointment as needed  Providers on your designated Care Team:   Murray Hodgkins, NP Christell Faith, PA-C Cadence Kathlen Mody, [place]  COVID-19 Vaccine Information can be found at: [email or web address] For questions related to vaccine distribution or appointments, please email [email or web address] or call [phone].

## 2022-05-31 ENCOUNTER — Encounter: Payer: Self-pay | Admitting: Internal Medicine

## 2022-05-31 ENCOUNTER — Ambulatory Visit (INDEPENDENT_AMBULATORY_CARE_PROVIDER_SITE_OTHER): Payer: 59 | Admitting: Internal Medicine

## 2022-05-31 VITALS — BP 128/80 | HR 84 | Temp 98.2°F | Resp 16 | Ht 71.5 in | Wt 335.0 lb

## 2022-05-31 DIAGNOSIS — F419 Anxiety disorder, unspecified: Secondary | ICD-10-CM | POA: Diagnosis not present

## 2022-05-31 DIAGNOSIS — R0602 Shortness of breath: Secondary | ICD-10-CM | POA: Diagnosis not present

## 2022-05-31 DIAGNOSIS — F321 Major depressive disorder, single episode, moderate: Secondary | ICD-10-CM | POA: Diagnosis not present

## 2022-05-31 DIAGNOSIS — Z713 Dietary counseling and surveillance: Secondary | ICD-10-CM | POA: Diagnosis not present

## 2022-05-31 DIAGNOSIS — F1121 Opioid dependence, in remission: Secondary | ICD-10-CM | POA: Diagnosis not present

## 2022-05-31 MED ORDER — ZEPBOUND 2.5 MG/0.5ML ~~LOC~~ SOAJ: 2.5000 mg | SUBCUTANEOUS | 3 refills | Status: DC

## 2022-05-31 NOTE — Progress Notes (Signed)
Subjective:    Patient ID: Phillip Moss, male    DOB: 10-May-1990, 32 y.o.   MRN: [id number]  Patient here for  Chief Complaint  Patient presents with   Medical Management of Chronic Issues    HPI Here for follow up - previous sob. Had viral URI in January.  Saw Dr Rockey Situ 05/29/22.  Breathing better.  Recommended to continue to monitor.  If sob recurs or if other problems, mentioned consideration of ECHO.  No chest pain reported.  Blood pressure elevated - visit with cardiology and urology.  No abdominal pain or bowel change.  No dysphagia.  Had questions about weight loss medication.  Would like to try GLP-1 agonist.  Discussed possible side effects.  Discussed diet and exercise.     Past Medical History:  Diagnosis Date   Depression    Drug abuse ([place])    Obesity    Seizure [facility])    Past Surgical History:  Procedure Laterality Date   COLONOSCOPY WITH PROPOFOL N/A 03/25/2015   Procedure: COLONOSCOPY WITH PROPOFOL;  Surgeon: Josefine Class, MD;  Location[facility] ENDOSCOPY;  Service: Endoscopy;  Laterality: N/A;   LEG SURGERY     rod put in in the left leg after a fracture   plate in shoulder Left    rod in leg Left    SHOULDER SURGERY     plate put in after fracture-left   TONSILLECTOMY     Family History  Problem Relation Age of Onset   High blood pressure Mother    Hypertension Mother    Hyperlipidemia Father    Social History   Socioeconomic History   Marital status: Single    Spouse name: Not on file   Number of children: 0   Years of education: college   Highest education level: Not on file  Occupational History    Comment: [employer]  Tobacco Use   Smoking status: Former    Packs/day: 1.00    Years: 12.00    Additional pack years: 0.00    Total pack years: 12.00    Types: Cigarettes    Quit date: 03/24/2014    Years since quitting: 8.2   Smokeless tobacco: Never   Tobacco comments:    Quit in 2016  Vaping Use   Vaping Use: Never  used  Substance and Sexual Activity   Alcohol use: No    Alcohol/week: 3.0 - 4.0 standard drinks of alcohol    Types: 3 - 4 Standard drinks or equivalent per week    Comment: heavy drinker in the past   Drug use: No    Types: Heroin    Comment: heroin use in the past. Completed rehab   Sexual activity: Not on file  Other Topics Concern   Not on file  Social History Narrative   Patient lives at home with his parents. Patient works at [employer].    Education college.   Right handed.   Caffeine - None   Social Determinants of Health   Financial Resource Strain: Not on file  Food Insecurity: Not on file  Transportation Needs: Not on file  Physical Activity: Not on file  Stress: Not on file  Social Connections: Not on file     Review of Systems  Constitutional:  Negative for appetite change and unexpected weight change.  HENT:  Negative for congestion and sinus pressure.   Respiratory:  Negative for cough, chest tightness and shortness of breath.  Cardiovascular:  Negative for chest pain and palpitations.  Gastrointestinal:  Negative for abdominal pain, diarrhea, nausea and vomiting.  Genitourinary:  Negative for difficulty urinating and dysuria.  Musculoskeletal:  Negative for joint swelling and myalgias.  Skin:  Negative for color change and rash.  Neurological:  Negative for dizziness and headaches.  Psychiatric/Behavioral:  Negative for agitation and dysphoric mood.        Objective:     BP 128/80   Pulse 84   Temp 98.2 F (36.8 C)   Resp 16   Ht 5' 11.5" (1.816 m)   Wt (!) 335 lb (152 kg)   SpO2 98%   BMI 46.07 kg/m  Wt Readings from Last 3 Encounters:  05/31/22 (!) 335 lb (152 kg)  05/29/22 (!) 338 lb 6 oz (153.5 kg)  04/03/22 (!) 323 lb (146.5 kg)    Physical Exam Vitals reviewed.  Constitutional:      General: He is not in acute distress.    Appearance: Normal appearance. He is well-developed.  HENT:     Head: Normocephalic and  atraumatic.     Right Ear: External ear normal.     Left Ear: External ear normal.  Eyes:     General: No scleral icterus.       Right eye: No discharge.        Left eye: No discharge.     Conjunctiva/sclera: Conjunctivae normal.  Cardiovascular:     Rate and Rhythm: Normal rate and regular rhythm.  Pulmonary:     Effort: Pulmonary effort is normal. No respiratory distress.     Breath sounds: Normal breath sounds.  Abdominal:     General: Bowel sounds are normal.     Palpations: Abdomen is soft.     Tenderness: There is no abdominal tenderness.  Musculoskeletal:        General: No swelling or tenderness.     Cervical back: Neck supple. No tenderness.  Lymphadenopathy:     Cervical: No cervical adenopathy.  Skin:    Findings: No erythema or rash.  Neurological:     Mental Status: He is alert.  Psychiatric:        Mood and Affect: Mood normal.        Behavior: Behavior normal.      Outpatient Encounter Medications as of 05/31/2022  Medication Sig   [DISCONTINUED] tirzepatide (ZEPBOUND) 2.5 MG/0.5ML Pen Inject 2.5 mg into the skin once a week.   buPROPion (WELLBUTRIN XL) 150 MG 24 hr tablet Take 150 mg by mouth daily.   clobetasol cream (TEMOVATE) AB-123456789 % Apply 1 Application topically 2 (two) times daily.   desvenlafaxine (PRISTIQ) 100 MG 24 hr tablet Take 100 mg by mouth daily.   gabapentin (NEURONTIN) 600 MG tablet Take 1 tablet by mouth 3 (three) times daily.   No facility-administered encounter medications on file as of 05/31/2022.     Lab Results  Component Value Date   WBC 6.6 12/27/2021   HGB 15.5 12/27/2021   HCT 44.8 12/27/2021   PLT 269.0 12/27/2021   GLUCOSE 94 12/27/2021   CHOL 138 12/27/2021   TRIG 101.0 12/27/2021   HDL 41.60 12/27/2021   LDLCALC 77 12/27/2021   ALT 33 12/27/2021   AST 19 12/27/2021   NA 138 12/27/2021   K 4.5 12/27/2021   CL 104 12/27/2021   CREATININE 0.76 12/27/2021   BUN 12 12/27/2021   CO2 28 12/27/2021   TSH 1.16 12/27/2021     No results found.  Assessment & Plan:  Anxiety Assessment & Plan: Being followed by psychiatry.  On wellbutrin and pristiq.  Stable. Follow.    Major depressive disorder, single episode, moderate (H[facility]) Assessment & Plan: Being followed by psychiatry.  Seeing regularly.  Continues on pristiq and wellbutrin.  Follow.    Opioid dependence in remission [facility]) Assessment & Plan: No opiates since 2015.  Seeing psychiatry.  Doing well.  Follow.   SOB (shortness of breath) on exertion Assessment & Plan: Resolved.  Saw cardiology. Felt to be related to previous viral infection.  Follow.    Weight loss counseling, encounter for Assessment & Plan: Discussed diet and exercise.  Discussed treatment options.  He is interested in starting GLP - 1 agonist.  Will start zepbound.  Discussed possible side effects of the medication.  Follow.        Einar Pheasant, MD

## 2022-06-01 ENCOUNTER — Encounter: Payer: Self-pay | Admitting: Internal Medicine

## 2022-06-01 ENCOUNTER — Other Ambulatory Visit: Payer: Self-pay

## 2022-06-01 ENCOUNTER — Other Ambulatory Visit: Payer: Self-pay | Admitting: Internal Medicine

## 2022-06-01 MED ORDER — ZEPBOUND 2.5 MG/0.5ML ~~LOC~~ SOAJ: 2.5000 mg | SUBCUTANEOUS | 3 refills | Status: AC

## 2022-06-01 NOTE — Telephone Encounter (Signed)
PA is needed

## 2022-06-10 ENCOUNTER — Encounter: Payer: Self-pay | Admitting: Internal Medicine

## 2022-06-10 DIAGNOSIS — Z713 Dietary counseling and surveillance: Secondary | ICD-10-CM | POA: Insufficient documentation

## 2022-06-10 NOTE — Assessment & Plan Note (Signed)
Being followed by psychiatry.  Seeing regularly.  Continues on pristiq and wellbutrin.  Follow.  

## 2022-06-10 NOTE — Assessment & Plan Note (Signed)
No opiates since 2015.  Seeing psychiatry.  Doing well.  Follow. 

## 2022-06-10 NOTE — Assessment & Plan Note (Signed)
Resolved.  Saw cardiology. Felt to be related to previous viral infection.  Follow.

## 2022-06-10 NOTE — Assessment & Plan Note (Signed)
Being followed by psychiatry.  On wellbutrin and pristiq.  Stable. Follow.  

## 2022-06-10 NOTE — Assessment & Plan Note (Signed)
Discussed diet and exercise.  Discussed treatment options.  He is interested in starting GLP - 1 agonist.  Will start zepbound.  Discussed possible side effects of the medication.  Follow.

## 2022-06-12 ENCOUNTER — Other Ambulatory Visit (HOSPITAL_COMMUNITY): Payer: Self-pay

## 2022-06-12 NOTE — Telephone Encounter (Signed)
Patient Advocate Encounter   Received notification from [facility] that prior authorization for Zepbound is required.   PA submitted on 06/12/2022 [place] [id number] Status is pending

## 2022-06-13 ENCOUNTER — Telehealth (HOSPITAL_COMMUNITY): Payer: Self-pay | Admitting: Pharmacy Technician

## 2022-06-13 NOTE — Telephone Encounter (Signed)
Patient Advocate Encounter   Received notification that prior authorization for Zepbound 2.5MG /0.5ML pen-injectors is required.   PA submitted on 06/13/2022 [place] Electronic PA Form Status is pending

## 2022-06-25 NOTE — Telephone Encounter (Signed)
Patient Advocate Naval architect  Received a fax from [employer] regarding Prior Authorization for Zepbound 2.5MG /0.5ML pen-injectors.   Authorization has been DENIED due to    Determination letter attached to patient chart

## 2022-06-30 NOTE — Telephone Encounter (Signed)
Per note, zepbound not covered.  Let me know if I need to do anything more.

## 2022-07-02 NOTE — Telephone Encounter (Signed)
PA for Zepbound was denied. Do you want to try something different?

## 2022-07-02 NOTE — Telephone Encounter (Signed)
Phillip Moss would be the other GLP 1.  I am not sure that insurance will cover.  I can refer him to the [facility] weight management program - physician run program to help with weight loss.

## 2022-07-03 NOTE — Telephone Encounter (Signed)
Patient is going to price wegovy and zepbound out of pocket with coupons, etc and let me know if desires referral.

## 2022-07-23 DIAGNOSIS — L409 Psoriasis, unspecified: Secondary | ICD-10-CM | POA: Diagnosis not present

## 2022-09-04 ENCOUNTER — Ambulatory Visit: Payer: 59 | Admitting: Internal Medicine

## 2023-03-18 DIAGNOSIS — Z6841 Body Mass Index (BMI) 40.0 and over, adult: Secondary | ICD-10-CM | POA: Diagnosis not present

## 2023-03-18 DIAGNOSIS — Z789 Other specified health status: Secondary | ICD-10-CM | POA: Diagnosis not present

## 2023-03-18 DIAGNOSIS — J069 Acute upper respiratory infection, unspecified: Secondary | ICD-10-CM | POA: Diagnosis not present

## 2023-03-18 DIAGNOSIS — J029 Acute pharyngitis, unspecified: Secondary | ICD-10-CM | POA: Diagnosis not present

## 2023-10-29 DIAGNOSIS — Z79899 Other long term (current) drug therapy: Secondary | ICD-10-CM | POA: Diagnosis not present

## 2024-01-29 NOTE — Telephone Encounter (Signed)
 open in error
